# Patient Record
Sex: Female | Born: 1935 | Race: White | Hispanic: No | Smoking: Never smoker
Health system: Southern US, Community
[De-identification: ages and names within clinical notes are randomized; demographics above are authoritative.]

## PROBLEM LIST (undated history)

## (undated) DIAGNOSIS — M109 Gout, unspecified: Secondary | ICD-10-CM

## (undated) DIAGNOSIS — I2510 Atherosclerotic heart disease of native coronary artery without angina pectoris: Secondary | ICD-10-CM

## (undated) DIAGNOSIS — R82998 Other abnormal findings in urine: Secondary | ICD-10-CM

## (undated) DIAGNOSIS — G458 Other transient cerebral ischemic attacks and related syndromes: Secondary | ICD-10-CM

## (undated) DIAGNOSIS — R413 Other amnesia: Secondary | ICD-10-CM

## (undated) DIAGNOSIS — I4891 Unspecified atrial fibrillation: Secondary | ICD-10-CM

## (undated) DIAGNOSIS — N898 Other specified noninflammatory disorders of vagina: Secondary | ICD-10-CM

## (undated) DIAGNOSIS — F0390 Unspecified dementia without behavioral disturbance: Secondary | ICD-10-CM

## (undated) DIAGNOSIS — Z6828 Body mass index (BMI) 28.0-28.9, adult: Secondary | ICD-10-CM

## (undated) DIAGNOSIS — Z79899 Other long term (current) drug therapy: Secondary | ICD-10-CM

## (undated) DIAGNOSIS — Z20828 Contact with and (suspected) exposure to other viral communicable diseases: Secondary | ICD-10-CM

## (undated) DIAGNOSIS — E785 Hyperlipidemia, unspecified: Secondary | ICD-10-CM

## (undated) DIAGNOSIS — Z8669 Personal history of other diseases of the nervous system and sense organs: Secondary | ICD-10-CM

## (undated) DIAGNOSIS — M25519 Pain in unspecified shoulder: Secondary | ICD-10-CM

## (undated) DIAGNOSIS — N189 Chronic kidney disease, unspecified: Secondary | ICD-10-CM

## (undated) DIAGNOSIS — Z87440 Personal history of urinary (tract) infections: Secondary | ICD-10-CM

## (undated) DIAGNOSIS — H938X9 Other specified disorders of ear, unspecified ear: Secondary | ICD-10-CM

## (undated) DIAGNOSIS — I10 Essential (primary) hypertension: Secondary | ICD-10-CM

## (undated) DIAGNOSIS — G43909 Migraine, unspecified, not intractable, without status migrainosus: Secondary | ICD-10-CM

## (undated) DIAGNOSIS — M129 Arthropathy, unspecified: Secondary | ICD-10-CM

## (undated) HISTORY — DX: Essential (primary) hypertension: I10

## (undated) HISTORY — DX: Body mass index (BMI) 28.0-28.9, adult: Z68.28

## (undated) HISTORY — DX: Hyperlipidemia, unspecified: E78.5

## (undated) HISTORY — DX: Personal history of urinary (tract) infections: Z87.440

## (undated) HISTORY — DX: Migraine, unspecified, not intractable, without status migrainosus: G43.909

## (undated) HISTORY — PX: EXTERNAL EAR SURGERY: SHX627

## (undated) HISTORY — DX: Other long term (current) drug therapy: Z79.899

## (undated) HISTORY — DX: Other specified noninflammatory disorders of vagina: N89.8

## (undated) HISTORY — DX: Other transient cerebral ischemic attacks and related syndromes: G45.8

## (undated) HISTORY — DX: Personal history of other diseases of the nervous system and sense organs: Z86.69

## (undated) HISTORY — DX: Unspecified atrial fibrillation: I48.91

## (undated) HISTORY — DX: Gout, unspecified: M10.9

## (undated) HISTORY — DX: Other specified disorders of ear, unspecified ear: H93.8X9

## (undated) HISTORY — DX: Pain in unspecified shoulder: M25.519

## (undated) HISTORY — DX: Other amnesia: R41.3

## (undated) HISTORY — DX: Contact with and (suspected) exposure to other viral communicable diseases: Z20.828

## (undated) HISTORY — DX: Other abnormal findings in urine: R82.998

## (undated) HISTORY — DX: Arthropathy, unspecified: M12.9

---
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
## 2013-06-30 ENCOUNTER — Ambulatory Visit: Payer: Medicare Other | Admitting: Neurology

## 2013-07-05 ENCOUNTER — Encounter: Payer: Self-pay | Admitting: Neurology

## 2013-07-05 ENCOUNTER — Ambulatory Visit: Payer: Medicare Other | Admitting: Neurology

## 2013-07-06 ENCOUNTER — Ambulatory Visit: Payer: Self-pay | Admitting: Neurology

## 2013-07-19 ENCOUNTER — Telehealth: Payer: Self-pay | Admitting: Neurology

## 2013-07-19 NOTE — Telephone Encounter (Signed)
Called patient and R/S appointment

## 2013-07-19 NOTE — Telephone Encounter (Signed)
Called patient to cancel appointment, no answer will try again later

## 2013-07-20 ENCOUNTER — Ambulatory Visit: Payer: Medicare Other | Admitting: Neurology

## 2013-07-26 ENCOUNTER — Encounter: Payer: Self-pay | Admitting: Neurology

## 2013-07-26 ENCOUNTER — Ambulatory Visit (INDEPENDENT_AMBULATORY_CARE_PROVIDER_SITE_OTHER): Payer: Medicare Other | Admitting: Neurology

## 2013-07-26 ENCOUNTER — Encounter (INDEPENDENT_AMBULATORY_CARE_PROVIDER_SITE_OTHER): Payer: Self-pay

## 2013-07-26 VITALS — BP 134/69 | HR 69 | Ht 60.0 in | Wt 142.0 lb

## 2013-07-26 DIAGNOSIS — R4189 Other symptoms and signs involving cognitive functions and awareness: Secondary | ICD-10-CM

## 2013-07-26 DIAGNOSIS — I4891 Unspecified atrial fibrillation: Secondary | ICD-10-CM

## 2013-07-26 DIAGNOSIS — F09 Unspecified mental disorder due to known physiological condition: Secondary | ICD-10-CM

## 2013-07-26 NOTE — Progress Notes (Signed)
[facility]    Provider:  Dr Hosie Poisson Referring Provider: Verlon Au, MD Primary Care Physician:  [name], NP  CC:  Memory decline  HPI:  Caitlyn Lynch is a 80 y.o. female here as a referral from Dr. Leavy Cella for memory decline  Patient thinks she is fine overall but has some trouble with short term memory. She attributes it to normal aging. She is currently driving, denies getting lost. Son notes there were some times where she has gotten lost and they have now limited her driving. He notes she is forgetting big chunks of her life, both recent and remote memory. Notes an episode where she got disoriented/confused when she had a UTI/URI. Saw a neurologist at that time (around 2 years ago) told it was possible early AD.   No history of head trauma, no MVA. No strokes or TIAs. Sister passed away from AD (passed away at age 52). No EtOH or tobacco usage. No prior head imaging. Otherwise healthy. Does have history of A fib, currently taking coumadin. Has had frequent UTIs.   Per notes from PCP, had difficulty with clock drawing and a 27/30 on MMSE.   Review of Systems: Out of a complete 14 system review, the patient complains of only the following symptoms, and all other reviewed systems are negative. + memory loss, confusion, headache, depression, too much sleep, decreased energy, fatigue, hearing loss  History   Social History  . Marital Status: Widowed    Spouse Name: N/A    Number of Children: N/A  . Years of Education: N/A   Occupational History  . Not on file.   Social History Main Topics  . Smoking status: Never Smoker   . Smokeless tobacco: Not on file  . Alcohol Use: No  . Drug Use: No  . Sexual Activity: Not on file   Other Topics Concern  . Not on file   Social History Narrative  . No narrative on file    Family History  Problem Relation Age of Onset  . Cancer Father     Bladder  . Diabetes Son   . Alzheimer's disease Sister       Past Medical History  Diagnosis Date  . Other and unspecified hyperlipidemia   . Migraine, unspecified, without mention of intractable migraine without mention of status migrainosus     CT head, Negative 2014  . Subclavian steal syndrome     noted on Carotid Doppler 08/2012  . Essential hypertension, benign     rate on EKG 56-60  . Other ear problems   . Atrial fibrillation   . Gout, unspecified   . Arthropathy, unspecified, site unspecified   . Pain in joint, shoulder region   . Other cells and casts in urine   . Other specified noninflammatory disorder of vagina   . Memory loss     27/30 on MMSE  . Contact with or exposure to other viral diseases(V01.79)   . Body mass index 28.0-28.9, adult   . Encounter for long-term (current) use of other medications   . Personal history of other disorders of nervous system and sense organs     Seen by Dr. Hazle Quant   . Personal history of urinary (tract) infection     Past Surgical History  Procedure Laterality Date  . Tonsillectomy  1945    Current Outpatient Prescriptions  Medication Sig Dispense Refill  . allopurinol (ZYLOPRIM) 100 MG tablet Take 100 mg by mouth daily.      Marland Kitchen  atenolol (TENORMIN) 100 MG tablet Take 100 mg by mouth daily.      . digoxin (LANOXIN) 0.125 MG tablet Take 0.125 mg by mouth daily.      Marland Kitchen. lisinopril-hydrochlorothiazide (PRINZIDE,ZESTORETIC) 20-12.5 MG per tablet Take 1 tablet by mouth daily.      Marland Kitchen. warfarin (COUMADIN) 2.5 MG tablet Take 2.5 mg by mouth 2 (two) times a week. Taking 1 daily PO, 1/2 on Tuesday and Thursday       No current facility-administered medications for this visit.    Allergies as of 07/26/2013  . (Not on File)    Vitals: There were no vitals taken for this visit. Last Weight:  Wt Readings from Last 1 Encounters:  No data found for Wt   Last Height:   Ht Readings from Last 1 Encounters:  No data found for Ht     Physical exam: Exam: Gen: NAD, conversant Eyes: anicteric  sclerae, moist conjunctivae HENT: Atraumatic, oropharynx clear Neck: Trachea midline; supple,  Lungs: CTA, no wheezing, rales, rhonic                          CV: RRR, no MRG Abdomen: Soft, non-tender;  Extremities: No peripheral edema  Skin: Normal temperature, no rash,  Psych: Appropriate affect, pleasant  Neuro: MS:  MOCA 25/30  CN: PERRL, EOMI no nystagmus, no ptosis, sensation intact to LT V1-V3 bilat, face symmetric, no weakness, hearing grossly intact, palate elevates symmetrically, shoulder shrug 5/5 bilat,  tongue protrudes midline, no fasiculations noted.  Motor: normal bulk and tone, decreased ROM L shoulder(reported to be chronic) Strength: 5/5  In all extremities with minimal chronic weakness in LUE proximal  Coord: rapid alternating and point-to-point (FNF, HTS) movements intact.  Reflexes: symmetrical, bilat downgoing toes  Sens: LT intact in all extremities  Gait: posture, stance, stride and arm-swing normal. Difficulty with tandem gait. Able to walk on heels and toes. Romberg absent.   Assessment:  After physical and neurologic examination, review of laboratory studies, imaging, neurophysiology testing and pre-existing records, assessment will be reviewed on the problem list.  Plan:  Treatment plan and additional workup will be reviewed under Problem List.  1)Cognitive decline 2)A fib  80y/o with hx of A fib, on coumadin, presenting for initial evaluation of progressive cognitive decline. Son notes difficulty with both short and long term memory. Physical exam overall unremarkable with MOCA of 25/30. Will check lab work, MRI brain. Pending results will consider starting patient on cognitive enhancer such as Aricept. Follow up once workup completed.    Elspeth ChoPeter Janine Reller, DO  [facility] [address] [place], [place] [address]  Phone [phone] Fax [phone]

## 2013-07-26 NOTE — Patient Instructions (Signed)
Overall you are doing fairly well but I do want to suggest a few things today:   Remember to drink plenty of fluid, eat healthy meals and do not skip any meals. Try to eat protein with a every meal and eat a healthy snack such as fruit or nuts in between meals. Try to keep a regular sleep-wake schedule and try to exercise daily, particularly in the form of walking, 20-30 minutes a day, if you can.   As far as diagnostic testing:  1)Please have some blood work completed today after checking out 2)We will call you to schedule a MRI of the brain  I would like to see you back in 4 months, sooner if we need to. Please call us with any interim questions, concerns, problems, updates or refill requests.   Please also call us for any test results so we can go over those with you on the phone.  My clinical assistant and will answer any of your questions and relay your messages to me and also relay most of my messages to you.   Our phone number is [phone]. We also have an after hours call service for urgent matters and there is a physician on-call for urgent questions. For any emergencies you know to call 911 or go to the nearest emergency room

## 2013-07-28 LAB — METHYLMALONIC ACID, SERUM: Methylmalonic Acid: 460 nmol/L — ABNORMAL HIGH (ref 0–378)

## 2013-07-28 LAB — VITAMIN B1, WHOLE BLOOD: THIAMINE: 194.1 nmol/L (ref 66.5–200.0)

## 2013-07-28 LAB — VITAMIN B12: Vitamin B-12: 600 pg/mL (ref 211–946)

## 2013-07-28 LAB — TSH: TSH: 1.91 u[IU]/mL (ref 0.450–4.500)

## 2013-07-28 NOTE — Progress Notes (Signed)
Quick Note:  Shared normal lab results, patient verbalized understanding ______

## 2013-08-08 ENCOUNTER — Inpatient Hospital Stay: Admission: RE | Admit: 2013-08-08 | Payer: Self-pay | Source: Ambulatory Visit

## 2013-08-17 ENCOUNTER — Ambulatory Visit
Admission: RE | Admit: 2013-08-17 | Discharge: 2013-08-17 | Disposition: A | Discharge status: Home / Self Care | Payer: Medicare Other | Source: Ambulatory Visit | Attending: Neurology | Admitting: Neurology

## 2013-08-17 DIAGNOSIS — R4189 Other symptoms and signs involving cognitive functions and awareness: Secondary | ICD-10-CM

## 2013-08-17 DIAGNOSIS — R413 Other amnesia: Secondary | ICD-10-CM

## 2013-08-30 ENCOUNTER — Ambulatory Visit (INDEPENDENT_AMBULATORY_CARE_PROVIDER_SITE_OTHER): Payer: Medicare Other | Admitting: Neurology

## 2013-08-30 ENCOUNTER — Encounter: Payer: Self-pay | Admitting: Neurology

## 2013-08-30 ENCOUNTER — Encounter (INDEPENDENT_AMBULATORY_CARE_PROVIDER_SITE_OTHER): Payer: Self-pay

## 2013-08-30 VITALS — BP 124/64 | HR 62 | Ht 60.0 in | Wt 144.0 lb

## 2013-08-30 DIAGNOSIS — F09 Unspecified mental disorder due to known physiological condition: Secondary | ICD-10-CM

## 2013-08-30 DIAGNOSIS — R4189 Other symptoms and signs involving cognitive functions and awareness: Secondary | ICD-10-CM

## 2013-08-30 DIAGNOSIS — R4182 Altered mental status, unspecified: Secondary | ICD-10-CM

## 2013-08-30 NOTE — Progress Notes (Signed)
[facility]    Provider:  Dr Hosie PoissonSumner Referring Provider: Iona HansenJones, Penny L, NP Primary Care Physician:  Iona HansenJones, Penny L, NP  CC:  Memory decline  HPI:  Caitlyn Lynch is a 80 y.o. female here as a referral from Dr. Yetta BarreJones for memory decline. Since last visit has had a MRI brain showing thalamic microhemorrhages but otherwise unremarkable. Returns today with her son who expresses concern over recent episode of severe AMS, confusion. These episodes last <20 minutes and the son notes that they have occurred frequently. After the events she is lethargic and slow to respond.    Initial visit 07/2013: Patient thinks she is fine overall but has some trouble with short term memory. She attributes it to normal aging. She is currently driving, denies getting lost. Son notes there were some times where she has gotten lost and they have now limited her driving. He notes she is forgetting big chunks of her life, both recent and remote memory. Notes an episode where she got disoriented/confused when she had a UTI/URI. Saw a neurologist at that time (around 2 years ago) told it was possible early AD.   No history of head trauma, no MVA. No strokes or TIAs. Sister passed away from AD (passed away at age 78). No EtOH or tobacco usage. No prior head imaging. Otherwise healthy. Does have history of A fib, currently taking coumadin. Has had frequent UTIs.   Per notes from PCP, had difficulty with clock drawing and a 27/30 on MMSE.   Review of Systems: Out of a complete 14 system review, the patient complains of only the following symptoms, and all other reviewed systems are negative. + memory loss, confusion, headache, depression, too much sleep, decreased energy, fatigue, hearing loss  History   Social History  . Marital Status: Widowed    Spouse Name: N/A    Number of Children: N/A  . Years of Education: N/A   Occupational History  . Not on file.   Social History Main Topics  . Smoking  status: Never Smoker   . Smokeless tobacco: Never Used  . Alcohol Use: No  . Drug Use: No  . Sexual Activity: Not on file   Other Topics Concern  . Not on file   Social History Narrative   Single, 4 children   Right handed   12 th grade   No caffeine    Family History  Problem Relation Age of Onset  . Cancer Father     Bladder  . Diabetes Son   . Alzheimer's disease Sister     Past Medical History  Diagnosis Date  . Other and unspecified hyperlipidemia   . Migraine, unspecified, without mention of intractable migraine without mention of status migrainosus     CT head, Negative 2014  . Subclavian steal syndrome     noted on Carotid Doppler 08/2012  . Essential hypertension, benign     rate on EKG 56-60  . Other ear problems   . Atrial fibrillation   . Gout, unspecified   . Arthropathy, unspecified, site unspecified   . Pain in joint, shoulder region   . Other cells and casts in urine   . Other specified noninflammatory disorder of vagina   . Memory loss     27/30 on MMSE  . Contact with or exposure to other viral diseases(V01.79)   . Body mass index 28.0-28.9, adult   . Encounter for long-term (current) use of other medications   . Personal history  of other disorders of nervous system and sense organs     Seen by Dr. Hazle Quantigby   . Personal history of urinary (tract) infection     Past Surgical History  Procedure Laterality Date  . Tonsillectomy  1945    Current Outpatient Prescriptions  Medication Sig Dispense Refill  . allopurinol (ZYLOPRIM) 100 MG tablet Take 100 mg by mouth daily.      Marland Kitchen. atenolol (TENORMIN) 100 MG tablet Take 100 mg by mouth daily.      Marland Kitchen. lisinopril-hydrochlorothiazide (PRINZIDE,ZESTORETIC) 20-12.5 MG per tablet Take 1 tablet by mouth daily.      Marland Kitchen. warfarin (COUMADIN) 2.5 MG tablet Take 2.5 mg by mouth 2 (two) times a week. Taking 1 daily PO, 1/2 on Tuesday and Thursday       No current facility-administered medications for this visit.     Allergies as of 08/30/2013  . (No Known Allergies)    Vitals: BP 124/64  Pulse 62  Ht 5' (1.524 m)  Wt 144 lb (65.318 kg)  BMI 28.12 kg/m2 Last Weight:  Wt Readings from Last 1 Encounters:  08/30/13 144 lb (65.318 kg)   Last Height:   Ht Readings from Last 1 Encounters:  08/30/13 5' (1.524 m)     Physical exam: Exam: Gen: NAD, conversant Eyes: anicteric sclerae, moist conjunctivae HENT: Atraumatic, oropharynx clear Neck: Trachea midline; supple,  Lungs: CTA, no wheezing, rales, rhonic                          CV: RRR, no MRG Abdomen: Soft, non-tender;  Extremities: No peripheral edema  Skin: Normal temperature, no rash,  Psych: Appropriate affect, pleasant  Neuro: MS:  MOCA 25/30 same as prior visit   CN: PERRL, EOMI no nystagmus, no ptosis, sensation intact to LT V1-V3 bilat, face symmetric, no weakness, hearing grossly intact, palate elevates symmetrically, shoulder shrug 5/5 bilat,  tongue protrudes midline, no fasiculations noted.  Motor: normal bulk and tone, decreased ROM L shoulder(reported to be chronic) Strength: 5/5  In all extremities with minimal chronic weakness in LUE proximal  Coord: rapid alternating and point-to-point (FNF, HTS) movements intact.  Reflexes: symmetrical, bilat downgoing toes  Sens: LT intact in all extremities  Gait: posture, stance, stride and arm-swing normal. Difficulty with tandem gait. Able to walk on heels and toes. Romberg absent.   Assessment:  After physical and neurologic examination, review of laboratory studies, imaging, neurophysiology testing and pre-existing records, assessment will be reviewed on the problem list.  Plan:  Treatment plan and additional workup will be reviewed under Problem List.  1)Cognitive decline 2)A fib 3)HTN  80y/o with hx of A fib, on coumadin, presenting for follow evaluation of progressive cognitive decline. Son notes difficulty with both short and long term memory. Since last  visit she has had a brain MRI which showed some signs of micro-hemorrhage but otherwise unremarkable. Son notes recurrent episodes of AMS lasting around 20minutes. Unclear etiology, based on description would have concern over possible complex partial seizure. MOCA stable compared to prior visit. Will check EEG. Counseled son to check patients BP during further events. If EEG unremarkable would consider addition of Exelon or Aricept. d.    Elspeth ChoPeter Kieanna Rollo, DO  [facility] [address] [place], [place] [address]  Phone [phone] Fax [phone]

## 2013-08-30 NOTE — Patient Instructions (Signed)
Overall you are doing fairly well but I do want to suggest a few things today:   As far as diagnostic testing:  1)Please schedule an EEG when you check out. I will call you with the results of this and we can discuss next steps.  Please check you blood pressure one to two times a day. If you have any more events please check your blood pressure during one of the events.   Please also call us for any test results so we can go over those with you on the phone.  My clinical assistant and will answer any of your questions and relay your messages to me and also relay most of my messages to you.   Our phone number is [phone]. We also have an after hours call service for urgent matters and there is a physician on-call for urgent questions. For any emergencies you know to call 911 or go to the nearest emergency room

## 2013-09-09 ENCOUNTER — Ambulatory Visit (INDEPENDENT_AMBULATORY_CARE_PROVIDER_SITE_OTHER): Payer: Medicare Other

## 2013-09-09 ENCOUNTER — Other Ambulatory Visit: Payer: Medicare Other | Admitting: Radiology

## 2013-09-09 DIAGNOSIS — R4182 Altered mental status, unspecified: Secondary | ICD-10-CM

## 2013-09-09 NOTE — Procedures (Signed)
    History:   Caitlyn Lynch is an 80 year old patient with a history of a progressive memory decline. The patient is being evaluated for an episode of severe altered mental status and confusion. She will have episodes lasting less than 20 minutes, and they have been recurrent. The patient is being evaluated for these episodes.  This is a routine EEG. No skull defects are noted. Medications include allopurinol, atenolol, lisinopril, hydrochlorothiazide, and Coumadin.   EEG classification: Normal awake  Description of the recording: The background rhythms of this recording consists of a fairly well modulated medium amplitude alpha rhythm of 8 Hz that is reactive to eye opening and closure. As the record progresses, the patient appears to remain in the waking state throughout the recording. Photic stimulation was performed, resulting in a bilateral and symmetric photic driving response. Hyperventilation was also performed, resulting in a minimal buildup of the background rhythm activities without significant slowing seen. At no time during the recording does there appear to be evidence of spike or spike wave discharges or evidence of focal slowing. EKG monitor shows an irregularly irregular rhythm with a heart rate of 56.  Impression: This is a normal EEG recording in the waking state. No evidence of ictal or interictal discharges are seen. Heart rhythm is irregularly irregular.

## 2013-09-28 ENCOUNTER — Telehealth: Payer: Self-pay | Admitting: Neurology

## 2013-09-28 NOTE — Telephone Encounter (Signed)
Spoke with patient's son Susy FrizzleMatt, he states that on 09/15/13 patient fell off the back stairs, she was home alone at the time of the fall so he does not know exactly what happened, possible episodes of blacking out with complaints of dizziness and migraines, patient son is very concerned with her please advise.

## 2013-09-28 NOTE — Telephone Encounter (Signed)
Patient's son calling with information about patient, states that she fell and is having some blackouts. Please call and advise.

## 2013-09-28 NOTE — Telephone Encounter (Signed)
Please call him and ask him to have her come in for a follow up to discuss next steps. Thanks.

## 2013-09-29 NOTE — Telephone Encounter (Signed)
Spoke with patient's son Susy FrizzleMatt, she was scheduled for Monday 10/03/13 at 8:30 am.

## 2013-10-03 ENCOUNTER — Encounter (INDEPENDENT_AMBULATORY_CARE_PROVIDER_SITE_OTHER): Payer: Self-pay

## 2013-10-03 ENCOUNTER — Ambulatory Visit (INDEPENDENT_AMBULATORY_CARE_PROVIDER_SITE_OTHER): Payer: Medicare Other | Admitting: Neurology

## 2013-10-03 ENCOUNTER — Encounter: Payer: Self-pay | Admitting: Neurology

## 2013-10-03 VITALS — BP 104/68 | HR 72 | Ht 60.0 in | Wt 146.0 lb

## 2013-10-03 DIAGNOSIS — R4189 Other symptoms and signs involving cognitive functions and awareness: Secondary | ICD-10-CM

## 2013-10-03 DIAGNOSIS — I951 Orthostatic hypotension: Secondary | ICD-10-CM

## 2013-10-03 DIAGNOSIS — F09 Unspecified mental disorder due to known physiological condition: Secondary | ICD-10-CM

## 2013-10-03 DIAGNOSIS — R4182 Altered mental status, unspecified: Secondary | ICD-10-CM

## 2013-10-03 NOTE — Progress Notes (Signed)
[facility]    Provider:  Dr Hosie Poisson Referring Provider: [name], NP Primary Care Physician:  [name], NP  CC:  Memory decline  HPI:  Caitlyn Lynch is a 80 y.o. female here as a referral from Dr. Yetta Barre for memory decline and periods of blacking out. Since last visit had a severe fall, noticing episodes of dizziness and headaches. Dizziness described as a light headed sensation, improves with sitting down. Denies drinking/hydrating well. Denies any vertigo, no nausea, no palpitations. Son notes she complains about headaches frequently, she states she does not recall having headaches.    Initial visit 07/2013: Patient thinks she is fine overall but has some trouble with short term memory. She attributes it to normal aging. She is currently driving, denies getting lost. Son notes there were some times where she has gotten lost and they have now limited her driving. He notes she is forgetting big chunks of her life, both recent and remote memory. Notes an episode where she got disoriented/confused when she had a UTI/URI. Saw a neurologist at that time (around 2 years ago) told it was possible early AD.   Son who expresses concern over recent episode of severe AMS, confusion. These episodes last <20 minutes and the son notes that they have occurred frequently. After the events she is lethargic and slow to respond.   No history of head trauma, no MVA. No strokes or TIAs. Sister passed away from AD (passed away at age 65). No EtOH or tobacco usage. No prior head imaging. Otherwise healthy. Does have history of A fib, currently taking coumadin. Has had frequent UTIs.   Per notes from PCP, had difficulty with clock drawing and a 27/30 on MMSE.   Review of Systems: Out of a complete 14 system review, the patient complains of only the following symptoms, and all other reviewed systems are negative. + memory loss, confusion, headache, depression, too much sleep, decreased  energy, fatigue, hearing loss  History   Social History  . Marital Status: Widowed    Spouse Name: N/A    Number of Children: N/A  . Years of Education: N/A   Occupational History  . Not on file.   Social History Main Topics  . Smoking status: Never Smoker   . Smokeless tobacco: Never Used  . Alcohol Use: No  . Drug Use: No  . Sexual Activity: Not on file   Other Topics Concern  . Not on file   Social History Narrative   Single, 4 children   Right handed   12 th grade   No caffeine    Family History  Problem Relation Age of Onset  . Cancer Father     Bladder  . Diabetes Son   . Alzheimer's disease Sister     Past Medical History  Diagnosis Date  . Other and unspecified hyperlipidemia   . Migraine, unspecified, without mention of intractable migraine without mention of status migrainosus     CT head, Negative 2014  . Subclavian steal syndrome     noted on Carotid Doppler 08/2012  . Essential hypertension, benign     rate on EKG 56-60  . Other ear problems   . Atrial fibrillation   . Gout, unspecified   . Arthropathy, unspecified, site unspecified   . Pain in joint, shoulder region   . Other cells and casts in urine   . Other specified noninflammatory disorder of vagina   . Memory loss  27/30 on MMSE  . Contact with or exposure to other viral diseases(V01.79)   . Body mass index 28.0-28.9, adult   . Encounter for long-term (current) use of other medications   . Personal history of other disorders of nervous system and sense organs     Seen by Dr. Hazle Quantigby   . Personal history of urinary (tract) infection     Past Surgical History  Procedure Laterality Date  . Tonsillectomy  1945    Current Outpatient Prescriptions  Medication Sig Dispense Refill  . allopurinol (ZYLOPRIM) 100 MG tablet Take 100 mg by mouth daily.      Marland Kitchen. atenolol (TENORMIN) 100 MG tablet Take 100 mg by mouth daily.      Marland Kitchen. lisinopril-hydrochlorothiazide (PRINZIDE,ZESTORETIC) 20-12.5  MG per tablet Take 1 tablet by mouth daily.      Marland Kitchen. warfarin (COUMADIN) 2.5 MG tablet Take 2.5 mg by mouth 2 (two) times a week. Taking 1 daily PO, 1/2 on Tuesday and Thursday       No current facility-administered medications for this visit.    Allergies as of 10/03/2013  . (No Known Allergies)    Vitals: BP 104/68  Pulse 72  Ht 5' (1.524 m)  Wt 146 lb (66.225 kg)  BMI 28.51 kg/m2 Last Weight:  Wt Readings from Last 1 Encounters:  10/03/13 146 lb (66.225 kg)   Last Height:   Ht Readings from Last 1 Encounters:  10/03/13 5' (1.524 m)   Orthostatics (checked with manual BP) Prone 132/68 P 74 Standing 108/62 P 76  Physical exam: Exam: Gen: NAD, conversant Eyes: anicteric sclerae, moist conjunctivae HENT: Atraumatic, oropharynx clear Neck: Trachea midline; supple,  Lungs: CTA, no wheezing, rales, rhonic                          CV: RRR, no MRG Abdomen: Soft, non-tender;  Extremities: No peripheral edema  Skin: Normal temperature, no rash,  Psych: Appropriate affect, pleasant  Neuro: MS:  MOCA 25/30 same as prior visit   CN: PERRL, EOMI no nystagmus, no ptosis, sensation intact to LT V1-V3 bilat, face symmetric, no weakness, hearing grossly intact, palate elevates symmetrically, shoulder shrug 5/5 bilat,  tongue protrudes midline, no fasiculations noted.  Motor: normal bulk and tone, decreased ROM L shoulder(reported to be chronic) Strength: 5/5  In all extremities with minimal chronic weakness in LUE proximal  Coord: rapid alternating and point-to-point (FNF, HTS) movements intact.  Reflexes: symmetrical, bilat downgoing toes  Sens: LT intact in all extremities  Gait: posture, stance, stride and arm-swing normal. Difficulty with tandem gait. Able to walk on heels and toes. Romberg absent.   Assessment:  After physical and neurologic examination, review of laboratory studies, imaging, neurophysiology testing and pre-existing records, assessment will be  reviewed on the problem list.  Plan:  Treatment plan and additional workup will be reviewed under Problem List.  1)Cognitive decline 2)A fib 3)orthostatic hypotension  80y/o with hx of A fib, on coumadin, presenting for follow evaluation of progressive cognitive decline now with episodes of MAS and recently feeling light headed/dizzy upon standing. Recent EEG was unremarkable. Physical exam pertinent for signs of orthostatic hypotension, otherwise stable. Unclear etiology of her symptoms. Orthostatic hypotension can be related to poor fluid intake and/or over medication. Counseled patient to hydrate well, she will follow up with cardiologist to potentially adjust BP medications. If no improvement with correction of BP and symptoms persist can consider ambulatory EEG and/or further imaging. Discussed potential  use of cognitive enhancing medication but will hold off for now, can consider Aricept in the future.    Elspeth ChoPeter Tishana Clinkenbeard, DO  [facility] [address] [place], [place] [id number]  Phone [phone] Fax [phone]

## 2013-10-14 ENCOUNTER — Ambulatory Visit: Payer: Self-pay | Admitting: Neurology

## 2013-11-29 ENCOUNTER — Ambulatory Visit: Payer: Medicare Other | Admitting: Neurology

## 2013-12-22 NOTE — Telephone Encounter (Signed)
Noted  

## 2014-12-31 ENCOUNTER — Emergency Department (HOSPITAL_BASED_OUTPATIENT_CLINIC_OR_DEPARTMENT_OTHER)
Admission: EM | Admit: 2014-12-31 | Discharge: 2015-01-01 | Disposition: A | Discharge status: Home / Self Care | Payer: Medicare HMO | Attending: Emergency Medicine | Admitting: Emergency Medicine

## 2014-12-31 DIAGNOSIS — N189 Chronic kidney disease, unspecified: Secondary | ICD-10-CM | POA: Insufficient documentation

## 2014-12-31 DIAGNOSIS — Z79899 Other long term (current) drug therapy: Secondary | ICD-10-CM | POA: Diagnosis not present

## 2014-12-31 DIAGNOSIS — E785 Hyperlipidemia, unspecified: Secondary | ICD-10-CM | POA: Diagnosis not present

## 2014-12-31 DIAGNOSIS — Z7982 Long term (current) use of aspirin: Secondary | ICD-10-CM | POA: Diagnosis not present

## 2014-12-31 DIAGNOSIS — F0390 Unspecified dementia without behavioral disturbance: Secondary | ICD-10-CM | POA: Insufficient documentation

## 2014-12-31 DIAGNOSIS — I2510 Atherosclerotic heart disease of native coronary artery without angina pectoris: Secondary | ICD-10-CM | POA: Diagnosis not present

## 2014-12-31 DIAGNOSIS — M109 Gout, unspecified: Secondary | ICD-10-CM | POA: Diagnosis not present

## 2014-12-31 DIAGNOSIS — Z87440 Personal history of urinary (tract) infections: Secondary | ICD-10-CM | POA: Diagnosis not present

## 2014-12-31 DIAGNOSIS — N61 Inflammatory disorders of breast: Secondary | ICD-10-CM | POA: Insufficient documentation

## 2014-12-31 DIAGNOSIS — I129 Hypertensive chronic kidney disease with stage 1 through stage 4 chronic kidney disease, or unspecified chronic kidney disease: Secondary | ICD-10-CM | POA: Insufficient documentation

## 2014-12-31 DIAGNOSIS — N611 Abscess of the breast and nipple: Secondary | ICD-10-CM

## 2014-12-31 DIAGNOSIS — G43909 Migraine, unspecified, not intractable, without status migrainosus: Secondary | ICD-10-CM | POA: Diagnosis not present

## 2014-12-31 DIAGNOSIS — I4891 Unspecified atrial fibrillation: Secondary | ICD-10-CM | POA: Insufficient documentation

## 2014-12-31 HISTORY — DX: Unspecified dementia, unspecified severity, without behavioral disturbance, psychotic disturbance, mood disturbance, and anxiety: F03.90

## 2014-12-31 HISTORY — DX: Atherosclerotic heart disease of native coronary artery without angina pectoris: I25.10

## 2014-12-31 HISTORY — DX: Chronic kidney disease, unspecified: N18.9

## 2015-01-01 ENCOUNTER — Encounter (HOSPITAL_BASED_OUTPATIENT_CLINIC_OR_DEPARTMENT_OTHER): Payer: Self-pay

## 2015-01-01 MED ORDER — DOXYCYCLINE HYCLATE 100 MG PO TABS
100.0000 mg | ORAL_TABLET | Freq: Once | ORAL | Status: AC
  Administered 2015-01-01: 100 mg via ORAL
  Filled 2015-01-01: qty 1

## 2015-01-01 MED ORDER — DOXYCYCLINE HYCLATE 100 MG PO CAPS: 100.0000 mg | ORAL_CAPSULE | Freq: Two times a day (BID) | ORAL | Status: DC

## 2015-01-01 MED ORDER — MUPIROCIN CALCIUM 2 % EX CREA
TOPICAL_CREAM | Freq: Once | CUTANEOUS | Status: AC
  Administered 2015-01-01: 01:00:00 via TOPICAL
  Filled 2015-01-01: qty 15

## 2015-01-01 NOTE — ED Notes (Addendum)
Pt presents with son to ED as patient has dementia - pt reports left nipple wound, edema. Unknown how long area has been there. Pt with angioplasty via groin and three cardiac stents place in July. Pt traveled to area from [place] one week ago.

## 2015-01-01 NOTE — ED Provider Notes (Signed)
CSN: [id number]     Arrival date & time 12/31/14  2352 History  This chart was scribed for Paula Libra, MD by Placido Sou, ED scribe. This patient was seen in room MH06/MH06 and the patient's care was started at 12:14 AM.   Chief Complaint  Patient presents with  . Breast Problem   HPI  HPI Comments: Caitlyn Lynch is a 81 y.o. female, with a hx of dementia, who presents to the Emergency Department complaining of soreness, discoloration and swelling to her left nipple that she noticed earlier today. She is unsure of its onset. Symptoms are mild, pain is worse with palpation. She notes that it has been a long time since her last mammogram. Her son notes that she has recently also been complaining about acute pain to her bilateral arms that worsens when she reaches and grabs objects and he suspects arthritis; she will follow-up with her PCP regarding this. Her son notes she just had 3 stents put in place while living in [place] and further notes she was recently dx with an ovarian cyst which has not yet been treated.   Past Medical History  Diagnosis Date  . Other and unspecified hyperlipidemia   . Migraine, unspecified, without mention of intractable migraine without mention of status migrainosus     CT head, Negative 2014  . Subclavian steal syndrome     noted on Carotid Doppler 08/2012  . Essential hypertension, benign     rate on EKG 56-60  . Other ear problems   . Atrial fibrillation   . Gout, unspecified   . Arthropathy, unspecified, site unspecified   . Pain in joint, shoulder region   . Other cells and casts in urine   . Other specified noninflammatory disorder of vagina   . Memory loss     27/30 on MMSE  . Contact with or exposure to other viral diseases(V01.79)   . Body mass index 28.0-28.9, adult   . Encounter for long-term (current) use of other medications   . Personal history of other disorders of nervous system and sense organs     Seen by Dr. Hazle Quant   . Personal  history of urinary (tract) infection   . Dementia   . Coronary artery disease   . CKD (chronic kidney disease)    Past Surgical History  Procedure Laterality Date  . Tonsillectomy  1945   Family History  Problem Relation Age of Onset  . Cancer Father     Bladder  . Diabetes Son   . Alzheimer's disease Sister    Social History  Substance Use Topics  . Smoking status: Never Smoker   . Smokeless tobacco: Never Used  . Alcohol Use: No   OB History    No data available     Review of Systems  All other systems reviewed and are negative.  Allergies  Review of patient's allergies indicates no known allergies.  Home Medications   Prior to Admission medications   Medication Sig Start Date End Date Taking? Authorizing Provider  allopurinol (ZYLOPRIM) 100 MG tablet Take 100 mg by mouth daily.   Yes Historical Provider, MD  AMLODIPINE BESYLATE PO Take by mouth.   Yes Historical Provider, MD  apixaban (ELIQUIS) 5 MG TABS tablet Take 5 mg by mouth 2 (two) times daily.   Yes Historical Provider, MD  aspirin 81 MG tablet Take 81 mg by mouth daily.   Yes Historical Provider, MD  atenolol (TENORMIN) 100 MG tablet Take 100 mg  by mouth daily.   Yes Historical Provider, MD  Cholecalciferol (VITAMIN D-3 PO) Take by mouth.   Yes Historical Provider, MD  Clopidogrel Bisulfate (PLAVIX PO) Take by mouth.   Yes Historical Provider, MD  Donepezil HCl (ARICEPT PO) Take by mouth.   Yes Historical Provider, MD  losartan-hydrochlorothiazide (HYZAAR) 50-12.5 MG per tablet Take 1 tablet by mouth daily.   Yes Historical Provider, MD  nitroGLYCERIN (NITROSTAT) 0.4 MG SL tablet Place 0.4 mg under the tongue every 5 (five) minutes as needed for chest pain.   Yes Historical Provider, MD  Simvastatin (ZOCOR PO) Take by mouth.   Yes Historical Provider, MD  doxycycline (VIBRAMYCIN) 100 MG capsule Take 1 capsule (100 mg total) by mouth 2 (two) times daily. One po bid x 7 days 01/01/15   Lasya Vetter, MD   BP  152/69 mmHg  Pulse 85  Temp(Src) 98.5 F (36.9 C) (Oral)  Resp 18  Ht  (1.499 m)  Wt 158 lb (71.668 kg)  BMI 31.89 kg/m2  SpO2 98%   Physical Exam  General: Well-developed, well-nourished female in no acute distress; appearance consistent with age of record HENT: normocephalic; atraumatic Eyes: pupils equal, round and reactive to light; extraocular muscles intact Neck: supple Heart: regular rate and rhythm; no murmurs, rubs or gallops Lungs: clear to auscultation bilaterally Breasts: Breasts symmetric with equivocal mass palpated in left breast RUQ; lesion of left nipple that is violaceous with a pocket of purulent material; no axillary lymphadenopathy Abdomen: soft; nondistended; nontender; no masses or hepatosplenomegaly; bowel sounds present Extremities: No deformity; full range of motion; pulses normal Neurologic: Awake, alert; motor function intact in all extremities and symmetric; no facial droop Skin: Warm and dry Psychiatric: Normal mood and affect  ED Course  Procedures  DIAGNOSTIC STUDIES: Oxygen Saturation is 98% on RA, normal by my interpretation.    COORDINATION OF CARE: 12:20 AM Discussed treatment plan with pt at bedside and pt agreed to plan.  12:22 AM Breast exam completed and. Recommended that patient get a mammogram which can be arranged through her PCP.  INCISION AND DRAINAGE Performed by: Paula Libra L Consent: Verbal consent obtained. Risks and benefits: risks, benefits and alternatives were discussed Type: abscess  Body area: Left nipple  Anesthesia: local infiltration  Incision was made with an 18-gauge needle.  Local anesthetic: None   Complexity: Simple Blunt dissection to break up loculations  Drainage: purulent  Drainage amount: Moderate   Packing material: None   Patient tolerance: Patient tolerated the procedure well with no immediate complications.    MDM   Final diagnoses:  Abscess of left nipple     Paula Libra, MD 01/01/15 [phone]

## 2015-01-01 NOTE — Discharge Instructions (Signed)
Abscess °An abscess (boil or furuncle) is an infected area on or under the skin. This area is filled with yellowish-white fluid (pus) and other material (debris). °HOME CARE  °· Only take medicines as told by your doctor. °· If you were given antibiotic medicine, take it as directed. Finish the medicine even if you start to feel better. °· If gauze is used, follow your doctor's directions for changing the gauze. °· To avoid spreading the infection: °¨ Keep your abscess covered with a bandage. °¨ Wash your hands well. °¨ Do not share personal care items, towels, or whirlpools with others. °¨ Avoid skin contact with others. °· Keep your skin and clothes clean around the abscess. °· Keep all doctor visits as told. °GET HELP RIGHT AWAY IF:  °· You have more pain, puffiness (swelling), or redness in the wound site. °· You have more fluid or blood coming from the wound site. °· You have muscle aches, chills, or you feel sick. °· You have a fever. °MAKE SURE YOU:  °· Understand these instructions. °· Will watch your condition. °· Will get help right away if you are not doing well or get worse. °Document Released: 10/22/2007 Document Revised: 11/04/2011 Document Reviewed: 07/18/2011 °ExitCare® Patient Information ©2015 [employer]. This information is not intended to replace advice given to you by your health care provider. Make sure you discuss any questions you have with your health care provider. ° °

## 2015-01-02 ENCOUNTER — Other Ambulatory Visit: Payer: Self-pay | Admitting: Nurse Practitioner

## 2015-01-02 DIAGNOSIS — N6321 Unspecified lump in the left breast, upper outer quadrant: Secondary | ICD-10-CM

## 2015-01-04 LAB — CULTURE, ROUTINE-ABSCESS
Culture: NO GROWTH
Gram Stain: NONE SEEN

## 2015-01-19 ENCOUNTER — Ambulatory Visit
Admission: RE | Admit: 2015-01-19 | Discharge: 2015-01-19 | Disposition: A | Discharge status: Home / Self Care | Payer: Medicare HMO | Source: Ambulatory Visit | Attending: Nurse Practitioner | Admitting: Nurse Practitioner

## 2015-01-19 DIAGNOSIS — N6321 Unspecified lump in the left breast, upper outer quadrant: Secondary | ICD-10-CM

## 2015-05-07 ENCOUNTER — Ambulatory Visit: Payer: Medicare HMO | Admitting: Neurology

## 2015-06-14 ENCOUNTER — Encounter: Payer: Self-pay | Admitting: Neurology

## 2015-06-14 ENCOUNTER — Encounter: Payer: Self-pay | Admitting: *Deleted

## 2015-06-14 ENCOUNTER — Ambulatory Visit (INDEPENDENT_AMBULATORY_CARE_PROVIDER_SITE_OTHER): Payer: Medicare HMO | Admitting: Neurology

## 2015-06-14 VITALS — BP 145/67 | HR 62 | Ht 59.0 in | Wt 158.8 lb

## 2015-06-14 DIAGNOSIS — R4189 Other symptoms and signs involving cognitive functions and awareness: Secondary | ICD-10-CM

## 2015-06-14 MED ORDER — MEMANTINE HCL-DONEPEZIL HCL ER 28-10 MG PO CP24: 1.0000 | ORAL_CAPSULE | Freq: Every day | ORAL | Status: DC

## 2015-06-14 NOTE — Patient Instructions (Addendum)
Overall you are doing fairly well but I do want to suggest a few things today:   Remember to drink plenty of fluid, eat healthy meals and do not skip any meals. Try to eat protein with a every meal and eat a healthy snack such as fruit or nuts in between meals. Try to keep a regular sleep-wake schedule and try to exercise daily, particularly in the form of walking, 20-30 minutes a day, if you can.   As far as your medications are concerned, I would like to suggest: Continue Aricept  I would like to see you back in 6 months, sooner if we need to. Please call us with any interim questions, concerns, problems, updates or refill requests.   Please also call us for any test results so we can go over those with you on the phone.  My clinical assistant and will answer any of your questions and relay your messages to me and also relay most of my messages to you.   Our phone number is [phone]. We also have an after hours call service for urgent matters and there is a physician on-call for urgent questions. For any emergencies you know to call 911 or go to the nearest emergency room

## 2015-06-14 NOTE — Progress Notes (Signed)
[facility]    Provider:  Dr Lucia Gaskins Referring Provider: [name], NP Primary Care Physician:  [name], NP   CC: Memory decline  06/13/2013: She is a new patient to me who is formally seen Dr. Hosie Poisson. She lives with her son who is here and provides most information. Past medical history of high cholesterol, atrial fibrillation, cognitive decline, It waxes and wanes. She forgets conversations, things she has been recently told. It is more recent memories. She has a very good memory of past things. 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  It was difficult for a while, she was angry and she felt people were taking away her control and they would hold back and she would call back crying saying she couldn't do anything. She now lives with her son and 7 grandchildren. She had 2 siblings with Alzheimers. When she gets infections, her mental status changes and she gets combative. There are no delusions. She is in a good period right now.  MRi brain 08/2013: IMPRESSION:  Abnormal MRI brain (without) demonstrating: 1. Mild perisylvian atrophy. 2. Mild chronic small vessel ischemic disease. 3. Few right thalamic and right temporal chronic cerebral microhemorrhages noted on SWI views, could be related to underlying chronic small vessel ischemic disease or amyloid pathology.  B12 and TSH wnl 07/2013   Dr. Hosie Poisson 08/30/2013: MARGUITA VENNING is a 82 y.o. female here as a referral from Dr. Yetta Barre for memory decline. Since last visit has had a MRI brain showing thalamic microhemorrhages but otherwise unremarkable. Returns today with her son who expresses concern over recent episode of severe AMS, confusion. These episodes last <20 minutes and the son notes that they have occurred frequently. After the events she is lethargic and slow to respond.    Initial visit 07/2013: Patient thinks she is fine overall but  has some trouble with short term memory. She attributes it to normal aging. She is currently driving, denies getting lost. Son notes there were some times where she has gotten lost and they have now limited her driving. He notes she is forgetting big chunks of her life, both recent and remote memory. Notes an episode where she got disoriented/confused when she had a UTI/URI. Saw a neurologist at that time (around 2 years ago) told it was possible early AD.   No history of head trauma, no MVA. No strokes or TIAs. Sister passed away from AD (passed away at age 21). No EtOH or tobacco usage. No prior head imaging. Otherwise healthy. Does have history of A fib, currently taking coumadin. Has had frequent UTIs.   Per notes from PCP, had difficulty with clock drawing and a 27/30 on MMSE.   ROS: Fatigue, blurred vision, loss of vision, feeling cold, joint pain, runny nose, memory loss, confusion, dizziness, insomnia, sleepiness, tremor, depression, too much sleep, not enough sleep, disinterest in activities, hallucinations, racing thoughts. All others negative.  Social History   Social History  . Marital Status: Widowed    Spouse Name: N/A  . Number of Children: 4  . Years of Education: 12   Occupational History  . Not on file.   Social History Main Topics  . Smoking status: Never Smoker   . Smokeless tobacco: Never Used  . Alcohol Use: No  . Drug Use: No  . Sexual Activity: Not on file   Other Topics Concern  . Not on file   Social History  Narrative   Lives: with son, [name]   Single, 4 children   Right handed   12 th grade   No caffeine    Family History  Problem Relation Age of Onset  . Cancer Father     Bladder  . Diabetes Son   . Alzheimer's disease Sister     Past Medical History  Diagnosis Date  . Other and unspecified hyperlipidemia   . Migraine, unspecified, without mention of intractable migraine without mention of status migrainosus     CT head, Negative 2014  .  Subclavian steal syndrome     noted on Carotid Doppler 08/2012  . Essential hypertension, benign     rate on EKG 56-60  . Other ear problems   . Atrial fibrillation (HCC)   . Gout, unspecified   . Arthropathy, unspecified, site unspecified   . Pain in joint, shoulder region   . Other cells and casts in urine   . Other specified noninflammatory disorder of vagina   . Memory loss     27/30 on MMSE  . Contact with or exposure to other viral diseases(V01.79)   . Body mass index 28.0-28.9, adult   . Encounter for long-term (current) use of other medications   . Personal history of other disorders of nervous system and sense organs     Seen by Dr. Hazle Quant   . Personal history of urinary (tract) infection   . Dementia   . Coronary artery disease   . CKD (chronic kidney disease)     Past Surgical History  Procedure Laterality Date  . Tonsillectomy  1945  . External ear surgery      Current Outpatient Prescriptions  Medication Sig Dispense Refill  . allopurinol (ZYLOPRIM) 100 MG tablet Take 100 mg by mouth daily.    Marland Kitchen AMLODIPINE BESYLATE PO Take 5 mg by mouth.     Marland Kitchen apixaban (ELIQUIS) 5 MG TABS tablet Take 5 mg by mouth 2 (two) times daily.    Marland Kitchen atenolol (TENORMIN) 100 MG tablet Take 100 mg by mouth daily.    Marland Kitchen Clopidogrel Bisulfate (PLAVIX PO) Take 75 mg by mouth daily.     Marland Kitchen losartan-hydrochlorothiazide (HYZAAR) 50-12.5 MG per tablet Take 1 tablet by mouth daily. 100-12.5 mg    . nitroGLYCERIN (NITROSTAT) 0.4 MG SL tablet Place 0.4 mg under the tongue every 5 (five) minutes as needed for chest pain.    . Simvastatin (ZOCOR PO) Take 20 mg by mouth daily.     . traMADol (ULTRAM) 50 MG tablet Take 50 mg by mouth every 6 (six) hours as needed.    . [DISCONTINUED] lisinopril-hydrochlorothiazide (PRINZIDE,ZESTORETIC) 20-12.5 MG per tablet Take 1 tablet by mouth daily.    . [DISCONTINUED] warfarin (COUMADIN) 2.5 MG tablet Take 2.5 mg by mouth 2 (two) times a week. Taking 1 daily PO, 1/2 on  Tuesday and Thursday     No current facility-administered medications for this visit.    Allergies as of 06/14/2015  . (No Known Allergies)    Vitals: BP 145/67 mmHg  Pulse 62  Ht  (1.499 m)  Wt 158 lb 12.8 oz (72.031 kg)  BMI 32.06 kg/m2 Last Weight:  Wt Readings from Last 1 Encounters:  06/14/15 158 lb 12.8 oz (72.031 kg)   Last Height:   Ht Readings from Last 1 Encounters:  06/14/15  (1.499 m)   Physical exam: Exam: Gen: NAD,  obese  Eyes: Conjunctivae clear without exudates or hemorrhage  Neuro: Detailed Neurologic Exam  Speech:    Speech is normal Cognition:    MMSE 25/30 Cranial Nerves:    The pupils are equal, round, and reactive to light.  Visual fields are full to finger confrontation. Extraocular movements are intact. Trigeminal sensation is intact and the muscles of mastication are normal. The face is symmetric. The palate elevates in the midline. Hearing intact. Voice is normal. Shoulder shrug is normal. The tongue has normal motion without fasciculations.   Coordination:    Normal finger to nose and heel to shin.   Gait:    Not shuffling difficulty with tandem  Motor Observation:    No asymmetry, no atrophy, and no involuntary movements noted. Tone:    Normal muscle tone.    Posture:    Posture is normal. normal erect    Strength:    Strength is V/V in the upper and lower limbs.      Sensation: intact to LT         Assessment/Plan:  82 year old female with PMHx cognitive decline, afib. She has been on Aricept. MMSE in 2015 27/30. Today 25/30. F/u 6 months. They may be interested in the CREAD clinical trial.  Naomie Dean, MD  [facility] [address] [place], [place] [address]  Phone [phone] Fax [phone]  A total of 30 minutes was spent face-to-face with this patient. Over half this time was spent on counseling patient on the cognitive decline diagnosis and different  diagnostic and therapeutic options available.

## 2015-06-28 ENCOUNTER — Telehealth: Payer: Self-pay

## 2015-06-28 NOTE — Telephone Encounter (Signed)
I spoke to the patient's son, Molli Hazard, in regards to the CREAD study. Molli Hazard stated that they are not interested in participating due to length of the study. I thanked him for his time and consideration.

## 2015-06-28 NOTE — Telephone Encounter (Signed)
Dr Lucia Gaskins- they turned down wanting to participate in study. Namzaric got denied by pt insurance. Do you want to call in something different? Please advise

## 2015-06-29 ENCOUNTER — Other Ambulatory Visit: Payer: Self-pay | Admitting: Neurology

## 2015-06-29 ENCOUNTER — Telehealth: Payer: Self-pay | Admitting: Neurology

## 2015-06-29 MED ORDER — DONEPEZIL HCL 10 MG PO TABS: 10.0000 mg | ORAL_TABLET | Freq: Two times a day (BID) | ORAL | Status: DC

## 2015-06-29 MED ORDER — MEMANTINE HCL 10 MG PO TABS: 10.0000 mg | ORAL_TABLET | Freq: Two times a day (BID) | ORAL | Status: DC

## 2015-06-29 NOTE — Telephone Encounter (Signed)
Yes, since the namzeric was denied she will have to take both Aricept and Namenda twice a day. Let husband know the Namzeric was denies so he will have to take his Aricept  daily as well as namenda twice daily. I will call it in and he can discuss with the pharmacy if he is confused about it thanks.

## 2015-06-29 NOTE — Telephone Encounter (Signed)
Spoke to son. Namzeric(combo aricept  and namenda XR) not approved. She was on Aricept  twice daily and was changed to aricept qam and namzaric qpm. So no [place] she will have to take aricept  twice a day and namenda  twice a day instead. He acknowledges understanding.

## 2015-12-12 ENCOUNTER — Encounter: Payer: Self-pay | Admitting: Nurse Practitioner

## 2015-12-12 ENCOUNTER — Ambulatory Visit (INDEPENDENT_AMBULATORY_CARE_PROVIDER_SITE_OTHER): Payer: Medicare HMO | Admitting: Nurse Practitioner

## 2015-12-12 VITALS — BP 117/56 | HR 71 | Ht 59.0 in | Wt 158.4 lb

## 2015-12-12 DIAGNOSIS — R4189 Other symptoms and signs involving cognitive functions and awareness: Secondary | ICD-10-CM

## 2015-12-12 NOTE — Patient Instructions (Addendum)
Continue Aricept at current dose  Exercise for overall health and well-being chair exercises are fine Follow-up in 6 months next visit with Dr. Lucia Gaskins

## 2015-12-12 NOTE — Progress Notes (Addendum)
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 Hosie Lynch. She lives with her son who is here and provides most information. Past medical history of high cholesterol, atrial fibrillation, cognitive decline, It waxes and wanes. She forgets conversations, things she has been recently told. It is more recent memories. She has a very good memory of past things. 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  It was difficult for a while, she was angry and she felt people were taking away her control and they would hold back and she would call back crying saying she couldn't do anything. She now lives with her son and 7 grandchildren. She had 2 siblings with Alzheimers. When she gets infections, her mental status changes and she gets combative. There are no delusions. She is in a good period right now. UPDATE 07/26/2017CM Caitlyn Lynch, 82 year old female returns for follow-up. She has a history of progressive memory disorder for at least 5 years. When last seen she was placed on Namzeric but started having side effects to the medication and stopped it. She went back to Aricept 10 mg 2 tablets daily she is not on Namenda. Memory score is stable today at 27/30. Son reports that she has had some flexing her arm and it is tremulous. She occasionally has tremor to her voice. The last time this happened was 3 months ago. He is concerned that she is getting Parkinson's. I see no evidence of that on exam today. She  has a diagnosed with restless legs Mirapex with good results. Her son claims that her appetite has changed however her weight is stable at 158. She returns for reevaluation. No recent falls  REVIEW OF SYSTEMS: Full 14 system review of  systems performed and notable only for those listed, all others are neg:  Constitutional: Fatigue  Cardiovascular: neg Ear/Nose/Throat: neg  Skin: neg Eyes: Blurred vision Respiratory: neg Gastroitestinal: Urinary frequency Hematology/Lymphatic: Easy bruising  Endocrine: neg Musculoskeletal: Walking difficulty Allergy/Immunology: neg Neurological: Memory loss Psychiatric: Confusion Sleep : Restless leg, daytime sleepiness   ALLERGIES: Allergies  Allergen Reactions  . Memantine Other (See Comments)    hallucinations    HOME MEDICATIONS: Outpatient Medications Prior to Visit  Medication Sig Dispense Refill  . allopurinol (ZYLOPRIM) 100 MG tablet Take 100 mg by mouth daily.    Marland Kitchen AMLODIPINE BESYLATE PO Take 5 mg by mouth.     Marland Kitchen apixaban (ELIQUIS) 5 MG TABS tablet Take 5 mg by mouth 2 (two) times daily.    Marland Kitchen atenolol (TENORMIN) 100 MG tablet Take 100 mg by mouth daily.    Marland Kitchen Clopidogrel Bisulfate (PLAVIX PO) Take 75 mg by mouth daily.     Marland Kitchen donepezil (ARICEPT) 10 MG tablet Take 1 tablet (10 mg total) by mouth 2 (two) times daily. (Patient taking differently: Take 20 mg by mouth at bedtime. ) 60 tablet 11  . Simvastatin (ZOCOR PO) Take 20 mg by mouth daily.     . traMADol (ULTRAM) 50 MG tablet Take 50 mg by mouth every 6 (six) hours as needed.    . nitroGLYCERIN (NITROSTAT) 0.4 MG SL tablet Place 0.4 mg under the tongue every 5 (five) minutes as needed for chest pain.    Marland Kitchen losartan-hydrochlorothiazide (HYZAAR) 50-12.5 MG  per tablet Take 1 tablet by mouth daily. 100-12.5 mg    . memantine (NAMENDA) 10 MG tablet Take 1 tablet (10 mg total) by mouth 2 (two) times daily. 60 tablet 11   No facility-administered medications prior to visit.     PAST MEDICAL HISTORY: Past Medical History:  Diagnosis Date  . Arthropathy, unspecified, site unspecified   . Atrial fibrillation (HCC)   . Body mass index 28.0-28.9, adult   . CKD (chronic kidney disease)   . Contact with or exposure to  other viral diseases(V01.79)   . Coronary artery disease   . Dementia   . Encounter for long-term (current) use of other medications   . Essential hypertension, benign    rate on EKG 56-60  . Gout, unspecified   . Memory loss    27/30 on MMSE  . Migraine, unspecified, without mention of intractable migraine without mention of status migrainosus    CT head, Negative 2014  . Other and unspecified hyperlipidemia   . Other cells and casts in urine   . Other ear problems   . Other specified noninflammatory disorder of vagina   . Pain in joint, shoulder region   . Personal history of other disorders of nervous system and sense organs    Seen by Dr. Hazle Quant   . Personal history of urinary (tract) infection   . Subclavian steal syndrome    noted on Carotid Doppler 08/2012    PAST SURGICAL HISTORY: Past Surgical History:  Procedure Laterality Date  . EXTERNAL EAR SURGERY    . TONSILLECTOMY  1945    FAMILY HISTORY: Family History  Problem Relation Age of Onset  . Cancer Father     Bladder  . Diabetes Son   . Alzheimer's disease Sister     SOCIAL HISTORY: Social History   Social History  . Marital status: Widowed    Spouse name: N/A  . Number of children: 4  . Years of education: 12   Occupational History  . Not on file.   Social History Main Topics  . Smoking status: Never Smoker  . Smokeless tobacco: Never Used  . Alcohol use No  . Drug use: No  . Sexual activity: Not on file   Other Topics Concern  . Not on file   Social History Narrative   Lives: with son, [name]   Single, 4 children   Right handed   12 th grade   No caffeine     PHYSICAL EXAM  Vitals:   12/12/15 1411  BP: (!) 117/56  Pulse: 71  Weight: 158 lb 6.4 oz (71.8 kg)  Height:  (1.499 m)   Body mass index is 31.99 kg/m.  Generalized: Well developed, Obese female in no acute distress  Head: normocephalic and atraumatic,. Oropharynx benign  Neck: Supple, no carotid bruits  Cardiac:  Regular rate rhythm, no murmur  Musculoskeletal: No deformity   Neurological examination   Mentation: Alert oriented to time, place, history taking. MMSE 27/30 missing items in orientation and 1 of 3 recall. AFT 7. Attention span and concentration appropriate.   Follows all commands speech and language fluent.   Cranial nerve II-XII: Fundoscopic exam reveals sharp disc margins.Pupils were equal round reactive to light extraocular movements were full, visual field were full on confrontational test. No decreased blink Facial sensation and strength were normal. hearing was intact to finger rubbing bilaterally. Uvula tongue midline. head turning and shoulder shrug were normal and symmetric.Tongue protrusion into cheek strength was normal.  Negative Myerson sign Motor: normal bulk and tone, full strength in the BUE, BLE, fine finger movements normal, no pronator drift. No focal weakness. No cogwheeling Sensory: normal and symmetric to light touch, pinprick, and  Vibration, Coordination: finger-nose-finger, heel-to-shin bilaterally, no dysmetria, no bradykinesia Reflexes: Symmetric upper and lower, plantar responses were flexor bilaterally. Gait and Station: Rising up from seated position without assistance, normal stance,  moderate stride, decreased  arm swing, smooth turning, ambulated 210 feet in 55 seconds. No assistive device   DIAGNOSTIC DATA (LABS, IMAGING, TESTING) - IASSESSMENT AND PLAN 82 year old female with PMHx cognitive decline, afib. She had side effects to Namzaric and now is back on Aricept 10 mg 2 caps daily MMSE 27/30.  F/u 6 months. They are not interested in the CREAD clinical trial. Son is concerned that she may be getting Parkinson disease. I see no signs on objective exam.  PLAN: Continue Aricept at current dose  Exercise for overall health and well-being chair exercises are fine Follow-up in 6 months next visit with Dr. Hassan Buckler Darrol Angel, [facility], [facility], APRN  [facility] [address] [place], [place] [address] [phone]  Personally  participated in and made any corrections needed to history, physical, neuro exam,assessment and plan as stated above.   Naomie Dean, MD [facility]

## 2016-04-02 ENCOUNTER — Encounter: Payer: Self-pay | Admitting: Neurology

## 2016-04-02 ENCOUNTER — Ambulatory Visit (INDEPENDENT_AMBULATORY_CARE_PROVIDER_SITE_OTHER): Payer: Medicare HMO | Admitting: Neurology

## 2016-04-02 VITALS — BP 130/52 | HR 64 | Wt 137.2 lb

## 2016-04-02 DIAGNOSIS — G3183 Dementia with Lewy bodies: Secondary | ICD-10-CM | POA: Diagnosis not present

## 2016-04-02 DIAGNOSIS — F0280 Dementia in other diseases classified elsewhere without behavioral disturbance: Secondary | ICD-10-CM

## 2016-04-02 NOTE — Patient Instructions (Signed)
Remember to drink plenty of fluid, eat healthy meals and do not skip any meals. Try to eat protein with a every meal and eat a healthy snack such as fruit or nuts in between meals. Try to keep a regular sleep-wake schedule and try to exercise daily, particularly in the form of walking, 20-30 minutes a day, if you can.   As far as your medications are concerned, I would like to suggest: Continue Aricept  As far as diagnostic testing: Neurocognitive testing. Lewy Body Dementia  I would like to see you back in 4 months, sooner if we need to. Please call us with any interim questions, concerns, problems, updates or refill requests.   Our phone number is [phone]. We also have an after hours call service for urgent matters and there is a physician on-call for urgent questions. For any emergencies you know to call 911 or go to the nearest emergency room

## 2016-04-02 NOTE — Progress Notes (Signed)
[facility]    Provider:  Dr Caitlyn Lynch Referring Provider: [name], NP Primary Care Physician:  Caitlyn Hansen, NP   CC: Memory decline  04/02/2016: Memory is progressively worsening. Here with son.  She was in the hospital. MMSE is 26/30, she is not washing, walking away from the stove and leaving it on. She stopped putting makeup on. More decline in short-term memory. There is no tremor on exam but they saw their doctor who suspects parkinson's disease. She denies depression. She doesn't want to be social anymore, she falls asleep all the time, she falls asleep during the day. She is having hallucinations. She thinks her x-husband gets in bed with her. People com ein and out of her room that are not there, it was worse on the [place]. Now on Aricept only. She will ask the same question over and over again. Son is requesting further workup including formal memory testing.   12/12/2015: Past medical history of high cholesterol, atrial fibrillation, cognitive decline, It waxes and wanes. She forgets conversations, things she has been recently told. It is more recent memories. She has a very good memory of past things. 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 It was difficult for a while, she was angry and she felt people were taking away her control and they would hold back and she would call back crying saying she couldn't do anything. She now lives with her son and 7 grandchildren. She had 2 siblings with Alzheimers. When she gets infections, her mental status changes and she gets combative. There are no delusions. She is in a good period right now.  06/13/2013: She is a new patient to me who is formally seen Caitlyn Lynch. She lives with her son who is here and provides most information. Past medical history of high cholesterol, atrial fibrillation, cognitive decline, It waxes and wanes. She forgets  conversations, things she has been recently told. It is more recent memories. She has a very good memory of past things. 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  It was difficult for a while, she was angry and she felt people were taking away her control and they would hold back and she would call back crying saying she couldn't do anything. She now lives with her son and 7 grandchildren. She had 2 siblings with Alzheimers. When she gets infections, her mental status changes and she gets combative. There are no delusions. She is in a good period right now. Runny nose and drooling.   MRi brain 08/2013: IMPRESSION:  Abnormal MRI brain (without) demonstrating: 1. Mild perisylvian atrophy. 2. Mild chronic small vessel ischemic disease. 3. Few right thalamic and right temporal chronic cerebral microhemorrhages noted on SWI views, could be related to underlying chronic small vessel ischemic disease or amyloid pathology.  B12 and TSH wnl 07/2013   Caitlyn Lynch 08/30/2013: Caitlyn Lynch is a 82 y.o. female here as a referral from Caitlyn. Yetta Lynch for memory decline. Since last visit has had a MRI brain showing thalamic microhemorrhages but otherwise unremarkable. Returns today with her son who expresses concern over recent episode of severe AMS, confusion. These episodes last <20 minutes and the son notes that they have occurred frequently. After the events she is lethargic and slow to respond.    Initial visit 07/2013: Patient thinks she is fine overall but has some trouble with  short term memory. She attributes it to normal aging. She is currently driving, denies getting lost. Son notes there were some times where she has gotten lost and they have now limited her driving. He notes she is forgetting big chunks of her life, both recent and remote memory. Notes an episode where she got disoriented/confused when she had a UTI/URI. Saw a  neurologist at that time (around 2 years ago) told it was possible early AD.   No history of head trauma, no MVA. No strokes or TIAs. Sister passed away from AD (passed away at age 80). No EtOH or tobacco usage. No prior head imaging. Otherwise healthy. Does have history of A fib, currently taking coumadin. Has had frequent UTIs.   Per notes from PCP, had difficulty with clock drawing and a 27/30 on MMSE.   ROS: Fatigue, blurred vision, loss of vision, feeling cold, joint pain, runny nose, memory loss, confusion, dizziness, insomnia, sleepiness, tremor, depression, too much sleep, not enough sleep, disinterest in activities, hallucinations, racing thoughts. All others negative.   Social History   Social History  . Marital status: Widowed    Spouse name: N/A  . Number of children: 4  . Years of education: 12   Occupational History  . Not on file.   Social History Main Topics  . Smoking status: Never Smoker  . Smokeless tobacco: Never Used  . Alcohol use No  . Drug use: No  . Sexual activity: Not on file   Other Topics Concern  . Not on file   Social History Narrative   Lives: with son, Susy Lynch   Single, 4 children   Right handed   12 th grade   No caffeine    Family History  Problem Relation Age of Onset  . Cancer Father     Bladder  . Diabetes Son   . Alzheimer's disease Sister     Past Medical History:  Diagnosis Date  . Arthropathy, unspecified, site unspecified   . Atrial fibrillation (HCC)   . Body mass index 28.0-28.9, adult   . CKD (chronic kidney disease)   . Contact with or exposure to other viral diseases(V01.79)   . Coronary artery disease   . Dementia   . Encounter for long-term (current) use of other medications   . Essential hypertension, benign    rate on EKG 56-60  . Gout, unspecified   . Memory loss    27/30 on MMSE  . Migraine, unspecified, without mention of intractable migraine without mention of status migrainosus    CT head, Negative  2014  . Other and unspecified hyperlipidemia   . Other cells and casts in urine   . Other ear problems   . Other specified noninflammatory disorder of vagina   . Pain in joint, shoulder region   . Personal history of other disorders of nervous system and sense organs    Seen by Caitlyn. Hazle Quantigby   . Personal history of urinary (tract) infection   . Subclavian steal syndrome    noted on Carotid Doppler 08/2012    Past Surgical History:  Procedure Laterality Date  . EXTERNAL EAR SURGERY    . TONSILLECTOMY  1945    Current Outpatient Prescriptions  Medication Sig Dispense Refill  . allopurinol (ZYLOPRIM) 100 MG tablet Take 100 mg by mouth daily.    Marland Kitchen. AMLODIPINE BESYLATE PO Take 5 mg by mouth.     Marland Kitchen. apixaban (ELIQUIS) 5 MG TABS tablet Take 5 mg by mouth 2 (two)  times daily.    Marland Kitchen. atenolol (TENORMIN) 100 MG tablet Take 100 mg by mouth daily.    Marland Kitchen. Clopidogrel Bisulfate (PLAVIX PO) Take 75 mg by mouth daily.     Marland Kitchen. donepezil (ARICEPT) 10 MG tablet Take 1 tablet (10 mg total) by mouth 2 (two) times daily. (Patient taking differently: Take 20 mg by mouth at bedtime. ) 60 tablet 11  . losartan-hydrochlorothiazide (HYZAAR) 100-12.5 MG tablet     . nitroGLYCERIN (NITROSTAT) 0.4 MG SL tablet Place 0.4 mg under the tongue every 5 (five) minutes as needed for chest pain.    . pantoprazole (PROTONIX) 20 MG tablet     . pramipexole (MIRAPEX) 0.25 MG tablet     . Simvastatin (ZOCOR PO) Take 20 mg by mouth daily.     . traMADol (ULTRAM) 50 MG tablet Take 50 mg by mouth every 6 (six) hours as needed.     No current facility-administered medications for this visit.     Allergies as of 04/02/2016 - Review Complete 04/02/2016  Allergen Reaction Noted  . Memantine Other (See Comments) 11/08/2015    Vitals: BP (!) 130/52 (BP Location: Right Arm, Patient Position: Sitting, Cuff Size: Normal)   Pulse 64   Wt 137 lb 3.2 oz (62.2 kg)   BMI 27.71 kg/m  Last Weight:  Wt Readings from Last 1 Encounters:    04/02/16 137 lb 3.2 oz (62.2 kg)   Last Height:   Ht Readings from Last 1 Encounters:  12/12/15 4\' 11"  (1.499 m)    Physical exam: Exam: Gen: NAD,  obese                 Eyes: Conjunctivae clear without exudates or hemorrhage  Neuro: Detailed Neurologic Exam  Speech:    Speech is normal Cognition:  MMSE - Mini Mental State Exam 04/02/2016 12/12/2015  Orientation to time 5 5  Orientation to Place 3 3  Registration 3 3  Attention/ Calculation 5 5  Recall 3 2  Language- name 2 objects 2 2  Language- repeat 1 1  Language- follow 3 step command 1 3  Language- read & follow direction 1 1  Write a sentence 1 1  Copy design 1 1  Total score 26 27      MMSE 25/30 Januray 2017 Cranial Nerves: Hypomimia    The pupils are equal, round, and reactive to light.  Visual fields are full to finger confrontation. Extraocular movements are intact. Trigeminal sensation is intact and the muscles of mastication are normal. The face is symmetric. The palate elevates in the midline. Hearing intact. Voice is normal. Shoulder shrug is normal. The tongue has normal motion without fasciculations.   Coordination:    Normal finger to nose and heel to shin.   Gait:    Not shuffling, some mildly dec arm swing R>L,  difficulty with tandem  Motor Observation:    No asymmetry, no atrophy, and no involuntary movements noted. Tone:    Mild cogwheeling on the right with facilitation  Posture:    Posture is normal. normal erect    Strength:    Strength is V/V in the upper and lower limbs.      Sensation: intact to LT         Assessment/Plan:  82 year old female with PMHx progressive cognitive decline, afib. She has been on Aricept. MMSE in 2015 27/30. Today 26/30. F/u 6 months. They declined the CREAD clinical trial.   - Patient with hallucinations, progressive memory  loss, may be Lewy Body dementia. Son requests formal neurocognitive testing. They live in [place], would like formal  memory testing in [facility] with Caitlyn. Jacquelyne Balint.   - Continue aricept.  Naomie Dean, MD  [facility] [address] [place], [place] [address]  Phone [phone] Fax [phone]  A total of 30 minutes was spent face-to-face with this patient. Over half this time was spent on counseling patient on the cognitive decline diagnosis and different diagnostic and therapeutic options available.

## 2016-04-05 DIAGNOSIS — F0280 Dementia in other diseases classified elsewhere without behavioral disturbance: Secondary | ICD-10-CM | POA: Insufficient documentation

## 2016-04-05 DIAGNOSIS — G3183 Dementia with Lewy bodies: Secondary | ICD-10-CM

## 2016-06-16 ENCOUNTER — Ambulatory Visit: Payer: Medicare HMO | Admitting: Neurology

## 2016-07-30 ENCOUNTER — Ambulatory Visit: Payer: Medicare HMO | Admitting: Neurology

## 2016-09-11 ENCOUNTER — Ambulatory Visit: Payer: Medicare HMO | Admitting: Neurology

## 2016-10-16 ENCOUNTER — Ambulatory Visit: Payer: Medicare HMO | Admitting: Neurology

## 2017-05-01 ENCOUNTER — Emergency Department (HOSPITAL_COMMUNITY): Payer: Medicare HMO

## 2017-05-01 ENCOUNTER — Inpatient Hospital Stay (HOSPITAL_COMMUNITY)
Admission: EM | Admit: 2017-05-01 | Discharge: 2017-05-03 | DRG: 071 | Disposition: A | Discharge status: Hospice | Payer: Medicare HMO | Attending: Internal Medicine | Admitting: Internal Medicine

## 2017-05-01 ENCOUNTER — Other Ambulatory Visit: Payer: Self-pay

## 2017-05-01 ENCOUNTER — Inpatient Hospital Stay (HOSPITAL_COMMUNITY): Payer: Medicare HMO

## 2017-05-01 DIAGNOSIS — F0281 Dementia in other diseases classified elsewhere with behavioral disturbance: Secondary | ICD-10-CM | POA: Diagnosis not present

## 2017-05-01 DIAGNOSIS — T68XXXA Hypothermia, initial encounter: Secondary | ICD-10-CM | POA: Diagnosis not present

## 2017-05-01 DIAGNOSIS — W182XXA Fall in (into) shower or empty bathtub, initial encounter: Secondary | ICD-10-CM | POA: Diagnosis present

## 2017-05-01 DIAGNOSIS — Z7189 Other specified counseling: Secondary | ICD-10-CM | POA: Diagnosis not present

## 2017-05-01 DIAGNOSIS — Z79899 Other long term (current) drug therapy: Secondary | ICD-10-CM | POA: Diagnosis not present

## 2017-05-01 DIAGNOSIS — R269 Unspecified abnormalities of gait and mobility: Secondary | ICD-10-CM | POA: Diagnosis present

## 2017-05-01 DIAGNOSIS — Z7901 Long term (current) use of anticoagulants: Secondary | ICD-10-CM

## 2017-05-01 DIAGNOSIS — Z87440 Personal history of urinary (tract) infections: Secondary | ICD-10-CM

## 2017-05-01 DIAGNOSIS — S0003XA Contusion of scalp, initial encounter: Secondary | ICD-10-CM | POA: Diagnosis present

## 2017-05-01 DIAGNOSIS — I959 Hypotension, unspecified: Secondary | ICD-10-CM | POA: Diagnosis present

## 2017-05-01 DIAGNOSIS — I2510 Atherosclerotic heart disease of native coronary artery without angina pectoris: Secondary | ICD-10-CM | POA: Diagnosis present

## 2017-05-01 DIAGNOSIS — M109 Gout, unspecified: Secondary | ICD-10-CM | POA: Diagnosis present

## 2017-05-01 DIAGNOSIS — R680 Hypothermia, not associated with low environmental temperature: Secondary | ICD-10-CM | POA: Diagnosis present

## 2017-05-01 DIAGNOSIS — R001 Bradycardia, unspecified: Secondary | ICD-10-CM | POA: Diagnosis present

## 2017-05-01 DIAGNOSIS — R10819 Abdominal tenderness, unspecified site: Secondary | ICD-10-CM | POA: Diagnosis present

## 2017-05-01 DIAGNOSIS — N179 Acute kidney failure, unspecified: Secondary | ICD-10-CM | POA: Diagnosis not present

## 2017-05-01 DIAGNOSIS — Z8052 Family history of malignant neoplasm of bladder: Secondary | ICD-10-CM

## 2017-05-01 DIAGNOSIS — G3183 Dementia with Lewy bodies: Secondary | ICD-10-CM | POA: Diagnosis present

## 2017-05-01 DIAGNOSIS — Z833 Family history of diabetes mellitus: Secondary | ICD-10-CM

## 2017-05-01 DIAGNOSIS — Z66 Do not resuscitate: Secondary | ICD-10-CM | POA: Diagnosis present

## 2017-05-01 DIAGNOSIS — N183 Chronic kidney disease, stage 3 (moderate): Secondary | ICD-10-CM | POA: Diagnosis present

## 2017-05-01 DIAGNOSIS — I482 Chronic atrial fibrillation: Secondary | ICD-10-CM | POA: Diagnosis present

## 2017-05-01 DIAGNOSIS — W19XXXA Unspecified fall, initial encounter: Secondary | ICD-10-CM

## 2017-05-01 DIAGNOSIS — F0391 Unspecified dementia with behavioral disturbance: Secondary | ICD-10-CM | POA: Diagnosis not present

## 2017-05-01 DIAGNOSIS — I129 Hypertensive chronic kidney disease with stage 1 through stage 4 chronic kidney disease, or unspecified chronic kidney disease: Secondary | ICD-10-CM | POA: Diagnosis present

## 2017-05-01 DIAGNOSIS — I10 Essential (primary) hypertension: Secondary | ICD-10-CM | POA: Diagnosis not present

## 2017-05-01 DIAGNOSIS — R339 Retention of urine, unspecified: Secondary | ICD-10-CM | POA: Diagnosis present

## 2017-05-01 DIAGNOSIS — G9341 Metabolic encephalopathy: Principal | ICD-10-CM | POA: Diagnosis present

## 2017-05-01 DIAGNOSIS — K219 Gastro-esophageal reflux disease without esophagitis: Secondary | ICD-10-CM | POA: Diagnosis not present

## 2017-05-01 DIAGNOSIS — Z515 Encounter for palliative care: Secondary | ICD-10-CM | POA: Diagnosis not present

## 2017-05-01 DIAGNOSIS — R4182 Altered mental status, unspecified: Secondary | ICD-10-CM | POA: Diagnosis not present

## 2017-05-01 DIAGNOSIS — Z820 Family history of epilepsy and other diseases of the nervous system: Secondary | ICD-10-CM | POA: Diagnosis not present

## 2017-05-01 DIAGNOSIS — N189 Chronic kidney disease, unspecified: Secondary | ICD-10-CM

## 2017-05-01 LAB — BASIC METABOLIC PANEL
Anion gap: 9 (ref 5–15)
BUN: 56 mg/dL — AB (ref 6–20)
CALCIUM: 9.5 mg/dL (ref 8.9–10.3)
CHLORIDE: 108 mmol/L (ref 101–111)
CO2: 21 mmol/L — ABNORMAL LOW (ref 22–32)
CREATININE: 1.72 mg/dL — AB (ref 0.44–1.00)
GFR calc Af Amer: 30 mL/min — ABNORMAL LOW (ref >60)
GFR, EST NON AFRICAN AMERICAN: 26 mL/min — AB (ref >60)
Glucose, Bld: 100 mg/dL — ABNORMAL HIGH (ref 65–99)
Potassium: 4.1 mmol/L (ref 3.5–5.1)
SODIUM: 138 mmol/L (ref 135–145)

## 2017-05-01 LAB — URINALYSIS, ROUTINE W REFLEX MICROSCOPIC
Bilirubin Urine: NEGATIVE
GLUCOSE, UA: NEGATIVE mg/dL
Hgb urine dipstick: NEGATIVE
KETONES UR: NEGATIVE mg/dL
Leukocytes, UA: NEGATIVE
Nitrite: NEGATIVE
Protein, ur: NEGATIVE mg/dL
Specific Gravity, Urine: 1.014 (ref 1.005–1.030)
pH: 5 (ref 5.0–8.0)

## 2017-05-01 LAB — PROTIME-INR
INR: 1.28
Prothrombin Time: 15.9 seconds — ABNORMAL HIGH (ref 11.4–15.2)

## 2017-05-01 LAB — CBC
HCT: 30.4 % — ABNORMAL LOW (ref 36.0–46.0)
Hemoglobin: 9.9 g/dL — ABNORMAL LOW (ref 12.0–15.0)
MCH: 29.7 pg (ref 26.0–34.0)
MCHC: 32.6 g/dL (ref 30.0–36.0)
MCV: 91.3 fL (ref 78.0–100.0)
PLATELETS: 228 10*3/uL (ref 150–400)
RBC: 3.33 MIL/uL — AB (ref 3.87–5.11)
RDW: 13.6 % (ref 11.5–15.5)
WBC: 9.8 10*3/uL (ref 4.0–10.5)

## 2017-05-01 LAB — TSH: TSH: 1.881 u[IU]/mL (ref 0.350–4.500)

## 2017-05-01 LAB — CBG MONITORING, ED: Glucose-Capillary: 100 mg/dL — ABNORMAL HIGH (ref 65–99)

## 2017-05-01 MED ORDER — SODIUM CHLORIDE 0.9 % IV SOLN
INTRAVENOUS | Status: DC
  Administered 2017-05-01 – 2017-05-02 (×4): via INTRAVENOUS

## 2017-05-01 MED ORDER — MORPHINE SULFATE (PF) 4 MG/ML IV SOLN: 2.0000 mg | INTRAVENOUS | Status: DC | PRN

## 2017-05-01 MED ORDER — SODIUM CHLORIDE 0.9 % IV BOLUS (SEPSIS)
1000.0000 mL | Freq: Once | INTRAVENOUS | Status: AC
  Administered 2017-05-01: 1000 mL via INTRAVENOUS

## 2017-05-01 MED ORDER — ACETAMINOPHEN 500 MG PO TABS
1000.0000 mg | ORAL_TABLET | Freq: Once | ORAL | Status: AC
  Administered 2017-05-01: 1000 mg via ORAL
  Filled 2017-05-01: qty 2

## 2017-05-01 MED ORDER — LORAZEPAM 2 MG/ML IJ SOLN: 1.0000 mg | Freq: Four times a day (QID) | INTRAMUSCULAR | Status: DC | PRN

## 2017-05-01 NOTE — ED Notes (Signed)
Dr. Liu at bedside 

## 2017-05-01 NOTE — ED Provider Notes (Addendum)
[facility]   CSN: [id number] Arrival date & time: 05/01/17  1238     History   Chief Complaint Chief Complaint  Patient presents with  . Fall    HPI Caitlyn Lynch is a 83 y.o. female.  HPI  83 year old female who presents after unwitnessed fall.  She has a history of dementia, atrial fibrillation on Eliquis, coronary artery disease, and chronic kidney disease.  She lives at home with her son.  He reports that normally she walks back and forth between her bedroom and the bathroom on a routine basis.  Today when his wife had walked by her bathroom she was breathing heavily in the bathtub with hematoma to the back of her head.  She does state that she fell in the bathtub, but is unable to give any further history.  She currently denies any pain or complaints.  Her son states that more recently she has become occasionally aggressive and agitated.  They are concerned that she may not be eating and drinking normally.  No recent illnesses including fever, cough, shortness of breath, complaints of pain, vomiting, diarrhea, or changes in her urine.  Past Medical History:  Diagnosis Date  . Arthropathy, unspecified, site unspecified   . Atrial fibrillation (HCC)   . Body mass index 28.0-28.9, adult   . CKD (chronic kidney disease)   . Contact with or exposure to other viral diseases(V01.79)   . Coronary artery disease   . Dementia   . Encounter for long-term (current) use of other medications   . Essential hypertension, benign    rate on EKG 56-60  . Gout, unspecified   . Memory loss    27/30 on MMSE  . Migraine, unspecified, without mention of intractable migraine without mention of status migrainosus    CT head, Negative 2014  . Other and unspecified hyperlipidemia   . Other cells and casts in urine   . Other ear problems   . Other specified noninflammatory disorder of vagina   . Pain in joint, shoulder region   . Personal  history of other disorders of nervous system and sense organs    Seen by Dr. Hazle Quantigby   . Personal history of urinary (tract) infection   . Subclavian steal syndrome    noted on Carotid Doppler 08/2012    Patient Active Problem List   Diagnosis Date Noted  . Dementia with Lewy bodies 04/05/2016  . Cognitive decline 07/26/2013  . Atrial fibrillation (HCC) 07/26/2013    Past Surgical History:  Procedure Laterality Date  . EXTERNAL EAR SURGERY    . TONSILLECTOMY  1945    OB History    No data available       Home Medications    Prior to Admission medications   Medication Sig Start Date End Date Taking? Authorizing Provider  allopurinol (ZYLOPRIM) 100 MG tablet Take 100 mg by mouth daily.    [provider]  AMLODIPINE BESYLATE PO Take 5 mg by mouth.     [provider]  apixaban (ELIQUIS) 5 MG TABS tablet Take 5 mg by mouth 2 (two) times daily.    [provider]  atenolol (TENORMIN) 100 MG tablet Take 100 mg by mouth daily.    [provider]  Clopidogrel Bisulfate (PLAVIX PO) Take 75 mg by mouth daily.     [provider]  donepezil (ARICEPT) 10 MG tablet Take 1 tablet (10 mg total) by mouth 2 (two) times daily.  Patient taking differently: Take 20 mg by mouth at bedtime.  06/29/15   Anson Fret, MD  losartan-hydrochlorothiazide [facility]) 100-12.5 MG tablet  11/14/15   [provider]  nitroGLYCERIN (NITROSTAT) 0.4 MG SL tablet Place 0.4 mg under the tongue every 5 (five) minutes as needed for chest pain.    [provider]  pantoprazole (PROTONIX) 20 MG tablet  11/19/15   [provider]  pramipexole (MIRAPEX) 0.25 MG tablet  11/23/15   [provider]  Simvastatin (ZOCOR PO) Take 20 mg by mouth daily.     [provider]  traMADol (ULTRAM) 50 MG tablet Take 50 mg by mouth every 6 (six) hours as needed.    [provider]    Family History Family History  Problem Relation Age of  Onset  . Cancer Father        Bladder  . Diabetes Son   . Alzheimer's disease Sister     Social History Social History   Tobacco Use  . Smoking status: Never Smoker  . Smokeless tobacco: Never Used  Substance Use Topics  . Alcohol use: No  . Drug use: No     Allergies   Memantine   Review of Systems Review of Systems  Unable to perform ROS: Dementia     Physical Exam Updated Vital Signs BP (!) 145/75   Pulse 70   Temp (!) 92 F (33.3 C) (Rectal)   Resp (!) 35   SpO2 99%   Physical Exam Physical Exam  Nursing note and vitals reviewed. Constitutional: Well developed, well nourished, non-toxic, and in no acute distress Head: Normocephalic and hematoma to the posterior head.  Mouth/Throat: Oropharynx is clear and moist.  Neck: Normal range of motion. Neck supple.  Cardiovascular: Normal rate and regular rhythm.   Pulmonary/Chest: Effort normal and breath sounds normal.  Abdominal: Soft. There is no tenderness. There is no rebound and no guarding.  Musculoskeletal: Normal range of motion.  No deformity  Neurological: Alert, no facial droop, fluent speech, moves all extremities symmetrically, sensation to light touch intact throughout, pupils equal reactive to light Skin: Skin is warm and dry.  Psychiatric: Cooperative   ED Treatments / Results  Labs (all labs ordered are listed, but only abnormal results are displayed) Labs Reviewed  BASIC METABOLIC PANEL - Abnormal; Notable for the following components:      Result Value   CO2 21 (*)    Glucose, Bld 100 (*)    BUN 56 (*)    Creatinine, Ser 1.72 (*)    GFR calc non Af Amer 26 (*)    GFR calc Af Amer 30 (*)    All other components within normal limits  CBC - Abnormal; Notable for the following components:   RBC 3.33 (*)    Hemoglobin 9.9 (*)    HCT 30.4 (*)    All other components within normal limits  PROTIME-INR - Abnormal; Notable for the following components:   Prothrombin Time 15.9 (*)    All  other components within normal limits  CBG MONITORING, ED - Abnormal; Notable for the following components:   Glucose-Capillary 100 (*)    All other components within normal limits  CULTURE, BLOOD (ROUTINE X 2)  CULTURE, BLOOD (ROUTINE X 2)  URINE CULTURE  URINALYSIS, ROUTINE W REFLEX MICROSCOPIC  TSH    EKG  EKG Interpretation  Date/Time:  Friday May 01 2017 13:14:35 EST Ventricular Rate:  45 PR Interval:  252 QRS Duration: 96 QT  Interval:  530 QTC Calculation: 458 R Axis:   -34 Text Interpretation:  Sinus bradycardia with 1st degree A-V block Left axis deviation Left ventricular hypertrophy with repolarization abnormality Cannot rule out Septal infarct , age undetermined Abnormal ECG no prior EKG  Confirmed by Crista CurbLiu, Rahim Astorga [phone]([id number]) on 05/01/2017 1:26:08 PM       Radiology Dg Chest 2 View  Result Date: 05/01/2017 CLINICAL DATA:  Status post suspected fall. EXAM: CHEST  2 VIEW COMPARISON:  05/29/2016 FINDINGS: Mildly enlarged cardiac silhouette. Calcific atherosclerotic disease and tortuosity of the aorta. Mediastinal contours appear intact. There is no evidence of pneumothorax or focal consolidation. Increased interstitial markings. Osseous structures are without acute abnormality. Soft tissues are grossly normal. IMPRESSION: Enlarged cardiac silhouette with mild pulmonary vascular congestion. Electronically Signed   By: Ted Mcalpineobrinka  Dimitrova M.D.   On: 05/01/2017 14:01   Ct Head Wo Contrast  Result Date: 05/01/2017 CLINICAL DATA:  Unwitnessed fall. Found in bathtub. Left posterior head injury. EXAM: CT HEAD WITHOUT CONTRAST CT CERVICAL SPINE WITHOUT CONTRAST TECHNIQUE: Multidetector CT imaging of the head and cervical spine was performed following the standard protocol without intravenous contrast. Multiplanar CT image reconstructions of the cervical spine were also generated. COMPARISON:  10/31/2015 brain MRI. FINDINGS: CT HEAD FINDINGS Brain: No evidence of parenchymal  hemorrhage or extra-axial fluid collection. No mass lesion, mass effect, or midline shift. No CT evidence of acute infarction. Nonspecific mild to moderate subcortical and periventricular white matter hypodensity, most in keeping with chronic small vessel ischemic change. Cerebral volume is age appropriate. No ventriculomegaly. Vascular: No acute abnormality. Skull: No evidence of calvarial fracture. Sinuses/Orbits: Tiny fluid level in the left sphenoid sinus. Otherwise clear visualized paranasal sinuses. Other: Small to moderate left posterior scalp hematoma. Postsurgical changes are present in the right mastoid. The left mastoid air cells are unopacified. CT CERVICAL SPINE FINDINGS Alignment: Straightening of the cervical spine. No subluxation. Dens is well positioned between the lateral masses of C1. Skull base and vertebrae: No acute fracture. No primary bone lesion or focal pathologic process. Soft tissues and spinal canal: No prevertebral fluid or swelling. No visible canal hematoma. Disc levels: Moderate multilevel degenerative disc disease in the mid to lower cervical spine, most prominent at C5-6. Advanced bilateral facet arthropathy, asymmetrically prominent on the left. No significant degenerative foraminal stenosis. Upper chest: Apical right upper lobe 5 mm solid pulmonary nodule (series 9/ image 68). Other: Clear left mastoid air cells. Postsurgical changes in the right mastoid. No discrete thyroid nodules. No pathologically enlarged cervical nodes. IMPRESSION: 1. Small to moderate left posterior scalp hematoma. 2. No evidence of acute intracranial abnormality. No evidence of calvarial fracture. 3. No cervical spine fracture or subluxation. 4. Mild-to-moderate chronic small vessel ischemic change in the cerebral white matter. 5. Tiny fluid level in the left sphenoid sinus, cannot exclude mild acute sinusitis. 6. Moderate degenerative changes in the cervical spine as detailed. 7. Apical right upper lobe  5 mm solid pulmonary nodule. No follow-up needed if patient is low-risk. Non-contrast chest CT can be considered in 12 months if patient is high-risk. This recommendation follows the consensus statement: Guidelines for Management of Incidental Pulmonary Nodules Detected on CT Images: From the Fleischner Society 2017; Radiology 2017; 284:228-243. Electronically Signed   By: Delbert PhenixJason A Poff M.D.   On: 05/01/2017 13:53   Ct Cervical Spine Wo Contrast  Result Date: 05/01/2017 CLINICAL DATA:  Unwitnessed fall. Found in bathtub. Left posterior head injury. EXAM: CT HEAD WITHOUT CONTRAST CT CERVICAL SPINE WITHOUT  CONTRAST TECHNIQUE: Multidetector CT imaging of the head and cervical spine was performed following the standard protocol without intravenous contrast. Multiplanar CT image reconstructions of the cervical spine were also generated. COMPARISON:  10/31/2015 brain MRI. FINDINGS: CT HEAD FINDINGS Brain: No evidence of parenchymal hemorrhage or extra-axial fluid collection. No mass lesion, mass effect, or midline shift. No CT evidence of acute infarction. Nonspecific mild to moderate subcortical and periventricular white matter hypodensity, most in keeping with chronic small vessel ischemic change. Cerebral volume is age appropriate. No ventriculomegaly. Vascular: No acute abnormality. Skull: No evidence of calvarial fracture. Sinuses/Orbits: Tiny fluid level in the left sphenoid sinus. Otherwise clear visualized paranasal sinuses. Other: Small to moderate left posterior scalp hematoma. Postsurgical changes are present in the right mastoid. The left mastoid air cells are unopacified. CT CERVICAL SPINE FINDINGS Alignment: Straightening of the cervical spine. No subluxation. Dens is well positioned between the lateral masses of C1. Skull base and vertebrae: No acute fracture. No primary bone lesion or focal pathologic process. Soft tissues and spinal canal: No prevertebral fluid or swelling. No visible canal hematoma.  Disc levels: Moderate multilevel degenerative disc disease in the mid to lower cervical spine, most prominent at C5-6. Advanced bilateral facet arthropathy, asymmetrically prominent on the left. No significant degenerative foraminal stenosis. Upper chest: Apical right upper lobe 5 mm solid pulmonary nodule (series 9/ image 68). Other: Clear left mastoid air cells. Postsurgical changes in the right mastoid. No discrete thyroid nodules. No pathologically enlarged cervical nodes. IMPRESSION: 1. Small to moderate left posterior scalp hematoma. 2. No evidence of acute intracranial abnormality. No evidence of calvarial fracture. 3. No cervical spine fracture or subluxation. 4. Mild-to-moderate chronic small vessel ischemic change in the cerebral white matter. 5. Tiny fluid level in the left sphenoid sinus, cannot exclude mild acute sinusitis. 6. Moderate degenerative changes in the cervical spine as detailed. 7. Apical right upper lobe 5 mm solid pulmonary nodule. No follow-up needed if patient is low-risk. Non-contrast chest CT can be considered in 12 months if patient is high-risk. This recommendation follows the consensus statement: Guidelines for Management of Incidental Pulmonary Nodules Detected on CT Images: From the Fleischner Society 2017; Radiology 2017; 284:228-243. Electronically Signed   By: Delbert Phenix M.D.   On: 05/01/2017 13:53    Procedures Procedures (including critical care time) CRITICAL CARE Performed by: Lavera Guise   Total critical care time: 31 minutes  Critical care time was exclusive of separately billable procedures and treating other patients.  Critical care was necessary to treat or prevent imminent or life-threatening deterioration.  Critical care was time spent personally by me on the following activities: management of hypothermia, development of treatment plan with patient and/or surrogate as well as nursing, discussions with consultants, evaluation of patient's response  to treatment, examination of patient, obtaining history from patient or surrogate, ordering and performing treatments and interventions, ordering and review of laboratory studies, ordering and review of radiographic studies, pulse oximetry and re-evaluation of patient's condition.  Medications Ordered in ED Medications  acetaminophen (TYLENOL) tablet 1,000 mg (1,000 mg Oral Given 05/01/17 1505)     Initial Impression / Assessment and Plan / ED Course  I have reviewed the triage vital signs and the nursing notes.  Pertinent labs & imaging results that were available during my care of the patient were reviewed by me and considered in my medical decision making (see chart for details).     83 year old female who presents with a witnessed fall.  Mental  status close to baseline per son on patient arrival.  She has no focal neurological deficits.  Does have hematoma to the back of her head but no other injuries noted on exam.  CT head and cervical spine obtained as patient anticoagulated and given her age.  Imaging studies are visualized and shows no acute traumatic injuries.  She incidentally is hypothermic in the ED.  Rectal temperature of 91.5.  Unclear etiology.  Hemodynamically is stable, and has sinus bradycardia which may be related to hypothermia.  She is placed in [employer]  No signs of infection on urine or chest x-ray.  No other major electrolyte or metabolic derangements on blood work.  Has chronic kidney disease with elevated creatinine of 1.7.  This is close to her baseline as she has outside blood work through [facility] in August 2018 with creatinine of 1.68.  Thyroid studies are also ordered.  She also has anemia with a hemoglobin of 9.9.  This is also her baseline as she has had CBCs done through [facility] showing hemoglobin between 9.6 and 10.6.  Unclear etiology of her hypothermia currently.  Discussed with hospitalist service will admit for ongoing management.  Patient's son is concerned for  potential difficulty caring for patient at home and is requesting potential placement into nursing facility when patient discharged.  This is discussed with hospitalist service.   Final Clinical Impressions(s) / ED Diagnoses   Final diagnoses:  Fall, initial encounter  Hypothermia, initial encounter    ED Discharge Orders    None       Lavera Guise, MD 05/01/17 1514    Lavera Guise, MD 05/01/17 1515

## 2017-05-01 NOTE — ED Triage Notes (Signed)
Pt arrived EMS from a doctors office s/p unwitnessed fall. Pt found by family in an empty bathtub with a large hematoma to the back left of her head. Unknown LOC due to dementia. Pt does take Eliquis for hx of afib. Per family pt has a significant cardiac history, progressive kidney failure, and has recently had decline in mental status changes and decrease in appetite.

## 2017-05-01 NOTE — ED Notes (Signed)
Bair Hugger in place.  Settings: High Temp High [employer]

## 2017-05-01 NOTE — H&P (Signed)
History and Physical    Caitlyn RollingRuth V Lynch YNW:[id number] DOB: [birth year] DOA: 05/01/2017  PCP: Iona HansenJones, Penny L, NP Patient coming from: Home  Chief Complaint: fall and weakness  HPI: Caitlyn Lynch is a 83 y.o. female with medical history significant of Dementia, Atrial fibrillation, CAD, Chronic renal disease. Pt was found to have a temperature of 91.5 when her ED vital signs were taken.  Pt lives with her family.  Pt's family reports pt paces between bathroom and her bedroom.  Pt's Daughter in law noticed pt went to the bathroom but did not come out for about 15 minutes.  When she went to check on pt she had fallen and hit her head on the bathtub.   Son reports pt was breathing heavy. Family reports pt has recently had a decrease in her appetite.  She has been giving her food to the dog. Pt has had recent increasing confusion.  Pt has not had any recent illness. No cough, no uti symptoms, no diarrhea or vomiting.  Family reports they do not want aggressive treatment.     ED Course: Pt was seen in the ED by Dr. Verdie MosherLiu.  Pt had a Ct scan of her head to evaluate for stroke and injury post fall.  Pt was placed under Bare hugger blanket.  Pt's temperature remained low at 92 rectally after 3 hours.    Review of Systems: As per HPI otherwise all other systems reviewed and are negative   Ambulatory Status  Previously ambulatiory  Past Medical History:  Diagnosis Date  . Arthropathy, unspecified, site unspecified   . Atrial fibrillation (HCC)   . Body mass index 28.0-28.9, adult   . CKD (chronic kidney disease)   . Contact with or exposure to other viral diseases(V01.79)   . Coronary artery disease   . Dementia   . Encounter for long-term (current) use of other medications   . Essential hypertension, benign    rate on EKG 56-60  . Gout, unspecified   . Memory loss    27/30 on MMSE  . Migraine, unspecified, without mention of intractable migraine without mention of status migrainosus    CT head,  Negative 2014  . Other and unspecified hyperlipidemia   . Other cells and casts in urine   . Other ear problems   . Other specified noninflammatory disorder of vagina   . Pain in joint, shoulder region   . Personal history of other disorders of nervous system and sense organs    Seen by Dr. Hazle Quantigby   . Personal history of urinary (tract) infection   . Subclavian steal syndrome    noted on Carotid Doppler 08/2012    Past Surgical History:  Procedure Laterality Date  . EXTERNAL EAR SURGERY    . TONSILLECTOMY  1945    Social History   Socioeconomic History  . Marital status: Widowed    Spouse name: Not on file  . Number of children: 4  . Years of education: 6912  . Highest education level: Not on file  Social Needs  . Financial resource strain: Not on file  . Food insecurity - worry: Not on file  . Food insecurity - inability: Not on file  . Transportation needs - medical: Not on file  . Transportation needs - non-medical: Not on file  Occupational History  . Not on file  Tobacco Use  . Smoking status: Never Smoker  . Smokeless tobacco: Never Used  Substance and Sexual Activity  . Alcohol use:  No  . Drug use: No  . Sexual activity: Not on file  Other Topics Concern  . Not on file  Social History Narrative   Lives: with son, Caitlyn Lynch   Single, 4 children   Right handed   12 th grade   No caffeine    Allergies  Allergen Reactions  . Memantine Other (See Comments)    hallucinations    Family History  Problem Relation Age of Onset  . Cancer Father        Bladder  . Diabetes Son   . Alzheimer's disease Sister     Prior to Admission medications   Medication Sig Start Date End Date Taking? Authorizing Provider  allopurinol (ZYLOPRIM) 100 MG tablet Take 100 mg by mouth daily.    [provider]  AMLODIPINE BESYLATE PO Take 5 mg by mouth.     [provider]  apixaban (ELIQUIS) 5 MG TABS tablet Take 5 mg by mouth 2 (two) times daily.    [provider]  atenolol (TENORMIN) 100 MG tablet Take 100 mg by mouth daily.    [provider]  Clopidogrel Bisulfate (PLAVIX PO) Take 75 mg by mouth daily.     [provider]  donepezil (ARICEPT) 10 MG tablet Take 1 tablet (10 mg total) by mouth 2 (two) times daily. Patient taking differently: Take 20 mg by mouth at bedtime.  06/29/15   Anson Fret, MD  losartan-hydrochlorothiazide [facility]) 100-12.5 MG tablet  11/14/15   [provider]  nitroGLYCERIN (NITROSTAT) 0.4 MG SL tablet Place 0.4 mg under the tongue every 5 (five) minutes as needed for chest pain.    [provider]  pantoprazole (PROTONIX) 20 MG tablet  11/19/15   [provider]  pramipexole (MIRAPEX) 0.25 MG tablet  11/23/15   [provider]  Simvastatin (ZOCOR PO) Take 20 mg by mouth daily.     [provider]  traMADol (ULTRAM) 50 MG tablet Take 50 mg by mouth every 6 (six) hours as needed.    [provider]    Physical Exam: Vitals:   05/01/17 1510 05/01/17 1515 05/01/17 1530 05/01/17 1555  BP:    (!) 98/32  Pulse:  61 (!) 48 71  Resp:  (!) 23 13 (!) 29  Temp: (!) 92 F (33.3 C)   (!) 94 F (34.4 C)  TempSrc: Rectal   Rectal  SpO2:  94% 94% 97%     General:  Appears calm and comfortable Eyes:  PERRL, EOMI, normal lids, iris  No eye contact ENT: Neck:  no LAD, masses or thyromegaly Cardiovascular:  RRR, no m/r/g. No LE edema.  Respiratory: CTA bilaterally, no w/r/r. Normal respiratory effort. Abdomen:  soft, ntnd, NABS Skin:  no rash or induration seen on limited exam Musculoskeletal: no bony abnormality Psychiatric: responds only to pain Neurologic: nonresponsive   Labs on Admission: I have personally reviewed following labs and imaging studies  CBC: Recent Labs  Lab 05/01/17 1311  WBC 9.8  HGB 9.9*  HCT 30.4*  MCV 91.3  PLT 228   Basic Metabolic Panel: Recent Labs  Lab 05/01/17 1311  NA 138  K 4.1  CL 108  CO2 21*    GLUCOSE 100*  BUN 56*  CREATININE 1.72*  CALCIUM 9.5   GFR: CrCl cannot be calculated (Unknown ideal weight.). Liver Function Tests: No results for input(s): AST, ALT, ALKPHOS, BILITOT, PROT, ALBUMIN in the last 168 hours. No results for input(s): LIPASE, AMYLASE in  the last 168 hours. No results for input(s): AMMONIA in the last 168 hours. Coagulation Profile: Recent Labs  Lab 05/01/17 1311  INR 1.28   Cardiac Enzymes: No results for input(s): CKTOTAL, CKMB, CKMBINDEX, TROPONINI in the last 168 hours. BNP (last 3 results) No results for input(s): PROBNP in the last 8760 hours. HbA1C: No results for input(s): HGBA1C in the last 72 hours. CBG: Recent Labs  Lab 05/01/17 1304  GLUCAP 100*   Lipid Profile: No results for input(s): CHOL, HDL, LDLCALC, TRIG, CHOLHDL, LDLDIRECT in the last 72 hours. Thyroid Function Tests: No results for input(s): TSH, T4TOTAL, FREET4, T3FREE, THYROIDAB in the last 72 hours. Anemia Panel: No results for input(s): VITAMINB12, FOLATE, FERRITIN, TIBC, IRON, RETICCTPCT in the last 72 hours. Urine analysis:    Component Value Date/Time   COLORURINE YELLOW 05/01/2017 1403   APPEARANCEUR CLEAR 05/01/2017 1403   LABSPEC 1.014 05/01/2017 1403   PHURINE 5.0 05/01/2017 1403   GLUCOSEU NEGATIVE 05/01/2017 1403   HGBUR NEGATIVE 05/01/2017 1403   BILIRUBINUR NEGATIVE 05/01/2017 1403   KETONESUR NEGATIVE 05/01/2017 1403   PROTEINUR NEGATIVE 05/01/2017 1403   NITRITE NEGATIVE 05/01/2017 1403   LEUKOCYTESUR NEGATIVE 05/01/2017 1403    Creatinine Clearance: CrCl cannot be calculated (Unknown ideal weight.).  Sepsis Labs: @LABRCNTIP (procalcitonin:4,lacticidven:4) )No results found for this or any previous visit (from the past 240 hour(s)).   Radiological Exams on Admission: Dg Chest 2 View  Result Date: 05/01/2017 CLINICAL DATA:  Status post suspected fall. EXAM: CHEST  2 VIEW COMPARISON:  05/29/2016 FINDINGS: Mildly enlarged cardiac silhouette.  Calcific atherosclerotic disease and tortuosity of the aorta. Mediastinal contours appear intact. There is no evidence of pneumothorax or focal consolidation. Increased interstitial markings. Osseous structures are without acute abnormality. Soft tissues are grossly normal. IMPRESSION: Enlarged cardiac silhouette with mild pulmonary vascular congestion. Electronically Signed   By: Ted Mcalpine M.D.   On: 05/01/2017 14:01   Ct Head Wo Contrast  Result Date: 05/01/2017 CLINICAL DATA:  Unwitnessed fall. Found in bathtub. Left posterior head injury. EXAM: CT HEAD WITHOUT CONTRAST CT CERVICAL SPINE WITHOUT CONTRAST TECHNIQUE: Multidetector CT imaging of the head and cervical spine was performed following the standard protocol without intravenous contrast. Multiplanar CT image reconstructions of the cervical spine were also generated. COMPARISON:  10/31/2015 brain MRI. FINDINGS: CT HEAD FINDINGS Brain: No evidence of parenchymal hemorrhage or extra-axial fluid collection. No mass lesion, mass effect, or midline shift. No CT evidence of acute infarction. Nonspecific mild to moderate subcortical and periventricular white matter hypodensity, most in keeping with chronic small vessel ischemic change. Cerebral volume is age appropriate. No ventriculomegaly. Vascular: No acute abnormality. Skull: No evidence of calvarial fracture. Sinuses/Orbits: Tiny fluid level in the left sphenoid sinus. Otherwise clear visualized paranasal sinuses. Other: Small to moderate left posterior scalp hematoma. Postsurgical changes are present in the right mastoid. The left mastoid air cells are unopacified. CT CERVICAL SPINE FINDINGS Alignment: Straightening of the cervical spine. No subluxation. Dens is well positioned between the lateral masses of C1. Skull base and vertebrae: No acute fracture. No primary bone lesion or focal pathologic process. Soft tissues and spinal canal: No prevertebral fluid or swelling. No visible canal  hematoma. Disc levels: Moderate multilevel degenerative disc disease in the mid to lower cervical spine, most prominent at C5-6. Advanced bilateral facet arthropathy, asymmetrically prominent on the left. No significant degenerative foraminal stenosis. Upper chest: Apical right upper lobe 5 mm solid pulmonary nodule (series 9/ image 68). Other: Clear left mastoid air cells.  Postsurgical changes in the right mastoid. No discrete thyroid nodules. No pathologically enlarged cervical nodes. IMPRESSION: 1. Small to moderate left posterior scalp hematoma. 2. No evidence of acute intracranial abnormality. No evidence of calvarial fracture. 3. No cervical spine fracture or subluxation. 4. Mild-to-moderate chronic small vessel ischemic change in the cerebral white matter. 5. Tiny fluid level in the left sphenoid sinus, cannot exclude mild acute sinusitis. 6. Moderate degenerative changes in the cervical spine as detailed. 7. Apical right upper lobe 5 mm solid pulmonary nodule. No follow-up needed if patient is low-risk. Non-contrast chest CT can be considered in 12 months if patient is high-risk. This recommendation follows the consensus statement: Guidelines for Management of Incidental Pulmonary Nodules Detected on CT Images: From the Fleischner Society 2017; Radiology 2017; 284:228-243. Electronically Signed   By: Delbert Phenix M.D.   On: 05/01/2017 13:53   Ct Cervical Spine Wo Contrast  Result Date: 05/01/2017 CLINICAL DATA:  Unwitnessed fall. Found in bathtub. Left posterior head injury. EXAM: CT HEAD WITHOUT CONTRAST CT CERVICAL SPINE WITHOUT CONTRAST TECHNIQUE: Multidetector CT imaging of the head and cervical spine was performed following the standard protocol without intravenous contrast. Multiplanar CT image reconstructions of the cervical spine were also generated. COMPARISON:  10/31/2015 brain MRI. FINDINGS: CT HEAD FINDINGS Brain: No evidence of parenchymal hemorrhage or extra-axial fluid collection. No mass  lesion, mass effect, or midline shift. No CT evidence of acute infarction. Nonspecific mild to moderate subcortical and periventricular white matter hypodensity, most in keeping with chronic small vessel ischemic change. Cerebral volume is age appropriate. No ventriculomegaly. Vascular: No acute abnormality. Skull: No evidence of calvarial fracture. Sinuses/Orbits: Tiny fluid level in the left sphenoid sinus. Otherwise clear visualized paranasal sinuses. Other: Small to moderate left posterior scalp hematoma. Postsurgical changes are present in the right mastoid. The left mastoid air cells are unopacified. CT CERVICAL SPINE FINDINGS Alignment: Straightening of the cervical spine. No subluxation. Dens is well positioned between the lateral masses of C1. Skull base and vertebrae: No acute fracture. No primary bone lesion or focal pathologic process. Soft tissues and spinal canal: No prevertebral fluid or swelling. No visible canal hematoma. Disc levels: Moderate multilevel degenerative disc disease in the mid to lower cervical spine, most prominent at C5-6. Advanced bilateral facet arthropathy, asymmetrically prominent on the left. No significant degenerative foraminal stenosis. Upper chest: Apical right upper lobe 5 mm solid pulmonary nodule (series 9/ image 68). Other: Clear left mastoid air cells. Postsurgical changes in the right mastoid. No discrete thyroid nodules. No pathologically enlarged cervical nodes. IMPRESSION: 1. Small to moderate left posterior scalp hematoma. 2. No evidence of acute intracranial abnormality. No evidence of calvarial fracture. 3. No cervical spine fracture or subluxation. 4. Mild-to-moderate chronic small vessel ischemic change in the cerebral white matter. 5. Tiny fluid level in the left sphenoid sinus, cannot exclude mild acute sinusitis. 6. Moderate degenerative changes in the cervical spine as detailed. 7. Apical right upper lobe 5 mm solid pulmonary nodule. No follow-up needed if  patient is low-risk. Non-contrast chest CT can be considered in 12 months if patient is high-risk. This recommendation follows the consensus statement: Guidelines for Management of Incidental Pulmonary Nodules Detected on CT Images: From the Fleischner Society 2017; Radiology 2017; 284:228-243. Electronically Signed   By: Delbert Phenix M.D.   On: 05/01/2017 13:53    EKG: Independently reviewed.   Assessment/Plan   1) Hypothermia --Continue Bear hugger --TSH pending  --Critical care consulted.  Dr. Wallace Cullens advised Ct of abdomen.  He advised repeat Ct of her head in the am.  2)Altered mental status/Fall --Repeat head ct in am for possible cva  3) Hypotension -- Iv fluids bolus     DVT prophylaxis: none Code Status: no code  Family Communication: Son at bedside  Disposition Plan: admit Consults called: Critical care evaluated and advised Ct abdomen, Repeat head Ct in am.  Admission status: Inpatient    Langston MaskerKaren Shavon Zenz PA-C [facility]  If 7PM-7AM, please contact night-coverage [email or web address] Password [email or web address]  05/01/2017, 5:02 PM

## 2017-05-01 NOTE — ED Notes (Signed)
Pt placed on bear hugger.  

## 2017-05-01 NOTE — Consult Note (Signed)
PULMONARY / CRITICAL CARE MEDICINE   Name: Caitlyn Lynch MRN: [id number] DOB: 1931-01-04    ADMISSION DATE:  05/01/2017 CONSULTATION DATE: 05/01/2017  REFERRING MD:  [facility]  CHIEF COMPLAINT: Hypothermia following an unwitnessed fall.     HISTORY OF PRESENT ILLNESS:   At baseline this is an 83 year old with advanced dementia.  She does not recognize family members and spends most of her day sitting on the toilet.  She went to the bathroom earlier today was heard to fall and was found in the bathtub.  At baseline her dementia is sufficiently severe that she does not recognize family members.  Heard to fall in the bathroom to day and was found within a minute in the bathtub with a knot on her head.  She was brought to our department of emergency medicine where CT scan of the head was unremarkable. She was found to be hypothermic, with no obvious source of infection.  The chest x-ray was negative and urinalysis was negative.  Family denies that she had an acute illness prior to her fall.  Specifically she had no cough fevers chills or sweats, no abdominal pain, no unusual complaints.  There is no history of thyroid disease or use of steroids. PAST MEDICAL HISTORY :  She  has a past medical history of Arthropathy, unspecified, site unspecified, Atrial fibrillation (HCC), Body mass index 28.0-28.9, adult, CKD (chronic kidney disease), Contact with or exposure to other viral diseases(V01.79), Coronary artery disease, Dementia, Encounter for long-term (current) use of other medications, Essential hypertension, benign, Gout, unspecified, Memory loss, Migraine, unspecified, without mention of intractable migraine without mention of status migrainosus, Other and unspecified hyperlipidemia, Other cells and casts in urine, Other ear problems, Other specified noninflammatory disorder of vagina, Pain in joint, shoulder region, Personal history of other disorders of nervous system and sense organs, Personal history of  urinary (tract) infection, and Subclavian steal syndrome.  PAST SURGICAL HISTORY: She  has a past surgical history that includes Tonsillectomy (1945) and External ear surgery.  Allergies  Allergen Reactions  . Memantine Other (See Comments)    hallucinations    No current facility-administered medications on file prior to encounter.    Current Outpatient Medications on File Prior to Encounter  Medication Sig  . allopurinol (ZYLOPRIM) 100 MG tablet Take 100 mg by mouth daily.  Marland Kitchen. AMLODIPINE BESYLATE PO Take 5 mg by mouth.   Marland Kitchen. apixaban (ELIQUIS) 5 MG TABS tablet Take 5 mg by mouth 2 (two) times daily.  Marland Kitchen. atenolol (TENORMIN) 100 MG tablet Take 100 mg by mouth daily.  Marland Kitchen. Clopidogrel Bisulfate (PLAVIX PO) Take 75 mg by mouth daily.   Marland Kitchen. donepezil (ARICEPT) 10 MG tablet Take 1 tablet (10 mg total) by mouth 2 (two) times daily. (Patient taking differently: Take 20 mg by mouth at bedtime. )  . losartan-hydrochlorothiazide (HYZAAR) 100-12.5 MG tablet   . nitroGLYCERIN (NITROSTAT) 0.4 MG SL tablet Place 0.4 mg under the tongue every 5 (five) minutes as needed for chest pain.  . pantoprazole (PROTONIX) 20 MG tablet   . pramipexole (MIRAPEX) 0.25 MG tablet   . Simvastatin (ZOCOR PO) Take 20 mg by mouth daily.   . traMADol (ULTRAM) 50 MG tablet Take 50 mg by mouth every 6 (six) hours as needed.  . [DISCONTINUED] lisinopril-hydrochlorothiazide (PRINZIDE,ZESTORETIC) 20-12.5 MG per tablet Take 1 tablet by mouth daily.  . [DISCONTINUED] warfarin (COUMADIN) 2.5 MG tablet Take 2.5 mg by mouth 2 (two) times a week. Taking 1 daily PO, 1/2 on Tuesday  and Thursday    FAMILY HISTORY:  Her indicated that her mother is deceased. She indicated that her father is deceased. She indicated that the status of her sister is unknown. She indicated that the status of her son is unknown.   SOCIAL HISTORY: She  reports that  has never smoked. she has never used smokeless tobacco. She reports that she does not drink  alcohol or use drugs.  REVIEW OF SYSTEMS:   View of systems shows that her day is spent walking back and forth to the bathroom where she spends approximately 18 hours a day.  As noted her dementia is sufficiently severe that she is not recognizing some family members.  They say that she had hallucinations in the past and suspect she still has them but does not speak of them.  He has no past history of respiratory illness she does have a significant cardiovascular history with a subclavian steal, atrial fibrillation, CHF, and stents for coronary artery disease.  She also has an unexplained history of anemia for which her dose of Eliquis was this decreased approximately a year ago. as noted she has no history of thyroid or adrenal disease.  Skin breakdown is denied  SUBJECTIVE:  The patient is completely nonverbal.  VITAL SIGNS: BP (!) 98/32 (BP Location: Right Arm)   Pulse 71   Temp (!) 94 F (34.4 C) (Rectal)   Resp (!) 29   SpO2 97%   HEMODYNAMICS:    VENTILATOR SETTINGS:    INTAKE / OUTPUT: No intake/output data recorded.  PHYSICAL EXAMINATION: General: Is a frail chronically ill-appearing 83 year old. Neuro: Does not respond to voice.  She does say ouch in response to nailbed pressure and withdraws both upper extremities left more vigorously than right.  She does not cross the midline with either eye looking to the right.  Pupils are equal. Cardiovascular: S1 and S2 are regular with a 3 out of 6 systolic ejection murmur.  She has 2+ dependent edema. Lungs: Respirations are unlabored, there are no wheezes. Abdomen: Abdominal exam is difficult due to her mental status. There may be some reproducible right sided tenderness but no overt mass guarding or rebound.  LABS:  BMET Recent Labs  Lab 05/01/17 1311  NA 138  K 4.1  CL 108  CO2 21*  BUN 56*  CREATININE 1.72*  GLUCOSE 100*    Electrolytes Recent Labs  Lab 05/01/17 1311  CALCIUM 9.5    CBC Recent Labs  Lab  05/01/17 1311  WBC 9.8  HGB 9.9*  HCT 30.4*  PLT 228    Coag's Recent Labs  Lab 05/01/17 1311  INR 1.28    Sepsis Markers No results for input(s): LATICACIDVEN, PROCALCITON, O2SATVEN in the last 168 hours.  ABG No results for input(s): PHART, PCO2ART, PO2ART in the last 168 hours.  Liver Enzymes No results for input(s): AST, ALT, ALKPHOS, BILITOT, ALBUMIN in the last 168 hours.  Cardiac Enzymes No results for input(s): TROPONINI, PROBNP in the last 168 hours.  Glucose Recent Labs  Lab 05/01/17 1304  GLUCAP 100*    Imaging Dg Chest 2 View  Result Date: 05/01/2017 CLINICAL DATA:  Status post suspected fall. EXAM: CHEST  2 VIEW COMPARISON:  05/29/2016 FINDINGS: Mildly enlarged cardiac silhouette. Calcific atherosclerotic disease and tortuosity of the aorta. Mediastinal contours appear intact. There is no evidence of pneumothorax or focal consolidation. Increased interstitial markings. Osseous structures are without acute abnormality. Soft tissues are grossly normal. IMPRESSION: Enlarged cardiac silhouette with mild pulmonary  vascular congestion. Electronically Signed   By: Ted Mcalpine M.D.   On: 05/01/2017 14:01   Ct Head Wo Contrast  Result Date: 05/01/2017 CLINICAL DATA:  Unwitnessed fall. Found in bathtub. Left posterior head injury. EXAM: CT HEAD WITHOUT CONTRAST CT CERVICAL SPINE WITHOUT CONTRAST TECHNIQUE: Multidetector CT imaging of the head and cervical spine was performed following the standard protocol without intravenous contrast. Multiplanar CT image reconstructions of the cervical spine were also generated. COMPARISON:  10/31/2015 brain MRI. FINDINGS: CT HEAD FINDINGS Brain: No evidence of parenchymal hemorrhage or extra-axial fluid collection. No mass lesion, mass effect, or midline shift. No CT evidence of acute infarction. Nonspecific mild to moderate subcortical and periventricular white matter hypodensity, most in keeping with chronic small vessel  ischemic change. Cerebral volume is age appropriate. No ventriculomegaly. Vascular: No acute abnormality. Skull: No evidence of calvarial fracture. Sinuses/Orbits: Tiny fluid level in the left sphenoid sinus. Otherwise clear visualized paranasal sinuses. Other: Small to moderate left posterior scalp hematoma. Postsurgical changes are present in the right mastoid. The left mastoid air cells are unopacified. CT CERVICAL SPINE FINDINGS Alignment: Straightening of the cervical spine. No subluxation. Dens is well positioned between the lateral masses of C1. Skull base and vertebrae: No acute fracture. No primary bone lesion or focal pathologic process. Soft tissues and spinal canal: No prevertebral fluid or swelling. No visible canal hematoma. Disc levels: Moderate multilevel degenerative disc disease in the mid to lower cervical spine, most prominent at C5-6. Advanced bilateral facet arthropathy, asymmetrically prominent on the left. No significant degenerative foraminal stenosis. Upper chest: Apical right upper lobe 5 mm solid pulmonary nodule (series 9/ image 68). Other: Clear left mastoid air cells. Postsurgical changes in the right mastoid. No discrete thyroid nodules. No pathologically enlarged cervical nodes. IMPRESSION: 1. Small to moderate left posterior scalp hematoma. 2. No evidence of acute intracranial abnormality. No evidence of calvarial fracture. 3. No cervical spine fracture or subluxation. 4. Mild-to-moderate chronic small vessel ischemic change in the cerebral white matter. 5. Tiny fluid level in the left sphenoid sinus, cannot exclude mild acute sinusitis. 6. Moderate degenerative changes in the cervical spine as detailed. 7. Apical right upper lobe 5 mm solid pulmonary nodule. No follow-up needed if patient is low-risk. Non-contrast chest CT can be considered in 12 months if patient is high-risk. This recommendation follows the consensus statement: Guidelines for Management of Incidental Pulmonary  Nodules Detected on CT Images: From the Fleischner Society 2017; Radiology 2017; 284:228-243. Electronically Signed   By: Delbert Phenix M.D.   On: 05/01/2017 13:53   Ct Cervical Spine Wo Contrast  Result Date: 05/01/2017 CLINICAL DATA:  Unwitnessed fall. Found in bathtub. Left posterior head injury. EXAM: CT HEAD WITHOUT CONTRAST CT CERVICAL SPINE WITHOUT CONTRAST TECHNIQUE: Multidetector CT imaging of the head and cervical spine was performed following the standard protocol without intravenous contrast. Multiplanar CT image reconstructions of the cervical spine were also generated. COMPARISON:  10/31/2015 brain MRI. FINDINGS: CT HEAD FINDINGS Brain: No evidence of parenchymal hemorrhage or extra-axial fluid collection. No mass lesion, mass effect, or midline shift. No CT evidence of acute infarction. Nonspecific mild to moderate subcortical and periventricular white matter hypodensity, most in keeping with chronic small vessel ischemic change. Cerebral volume is age appropriate. No ventriculomegaly. Vascular: No acute abnormality. Skull: No evidence of calvarial fracture. Sinuses/Orbits: Tiny fluid level in the left sphenoid sinus. Otherwise clear visualized paranasal sinuses. Other: Small to moderate left posterior scalp hematoma. Postsurgical changes are present in the right mastoid.  The left mastoid air cells are unopacified. CT CERVICAL SPINE FINDINGS Alignment: Straightening of the cervical spine. No subluxation. Dens is well positioned between the lateral masses of C1. Skull base and vertebrae: No acute fracture. No primary bone lesion or focal pathologic process. Soft tissues and spinal canal: No prevertebral fluid or swelling. No visible canal hematoma. Disc levels: Moderate multilevel degenerative disc disease in the mid to lower cervical spine, most prominent at C5-6. Advanced bilateral facet arthropathy, asymmetrically prominent on the left. No significant degenerative foraminal stenosis. Upper  chest: Apical right upper lobe 5 mm solid pulmonary nodule (series 9/ image 68). Other: Clear left mastoid air cells. Postsurgical changes in the right mastoid. No discrete thyroid nodules. No pathologically enlarged cervical nodes. IMPRESSION: 1. Small to moderate left posterior scalp hematoma. 2. No evidence of acute intracranial abnormality. No evidence of calvarial fracture. 3. No cervical spine fracture or subluxation. 4. Mild-to-moderate chronic small vessel ischemic change in the cerebral white matter. 5. Tiny fluid level in the left sphenoid sinus, cannot exclude mild acute sinusitis. 6. Moderate degenerative changes in the cervical spine as detailed. 7. Apical right upper lobe 5 mm solid pulmonary nodule. No follow-up needed if patient is low-risk. Non-contrast chest CT can be considered in 12 months if patient is high-risk. This recommendation follows the consensus statement: Guidelines for Management of Incidental Pulmonary Nodules Detected on CT Images: From the Fleischner Society 2017; Radiology 2017; 284:228-243. Electronically Signed   By: Delbert PhenixJason A Poff M.D.   On: 05/01/2017 13:53     STUDIES:  CT scan of the head did not show an acute event, chest x-ray is remarkable for mild CHF  DISCUSSION: This is an 83 year old with advanced dementia who fell in the bathtub earlier today.  She was brought to the department of emergency medicine and found to be hypothermic.  Her urine was clean and there was no obvious infiltrate on the chest x-ray.  Did not have any significant time of exposure to the cold.  Does not have a known history of thyroid disease nor has she been on steroids to suggest relative adrenal insufficiency.  I discussed the situation with family who describe a very advanced dementia.  Shared with them that the usual causes of hypothermia are severe infections, exposure to a cold environment, and endocrine disorders.  Advised him that I am in intensive care physician and that I could  provide life support measures including mechanical ventilation and drugs to support blood pressure however after discussion with them they do not feel that those would be appropriate supportive interventions.  They would like for their mother to be treated with empiric antibiotics and fluids.  I have suggested a CT scan of the abdomen to look for a source of sepsis more to wish sure the family that we have found the etiology of her problem then to look for a problem for which we are going to intervene.  I have also suggested a repeat CT scan in 12 or more hours as she has focal neurological findings on my exam.  Again the purpose of this exam is not to seek problems for which we are to intervene but to look for findings of will make it easier for family to make decisions about appropriate levels of care.  ASSESSMENT / PLAN:  PULMONARY A: Appears to have no active respiratory disease at this time.  She does not have an infiltrate on chest x-ray family denies a history of cough prior to her fall this  morning.   CARDIOVASCULAR A: Does have a significant history of CAD as well as atrial fibrillation intermittently.  I would suggest holding her beta-blocker for now, Although her bradycardia is likely secondary to her hypothermia, certainly conceivable that a low cardiac output state related to over aggressive beta-blockade is contributing to her hypothermia.  RENAL A: She has chronic renal insufficiency   GASTROINTESTINAL A: She has some right sided abdominal tenderness and a CT scan of the abdomen is suggested especially as diverticulitis might explain the constellation of findings.   HEMATOLOGIC A: Although she does not have a leukocytosis a debilitated 83 year old may not generate one even in the presence of significant infection.   ENDOCRINE A: TSH is normal.  I would suggest getting a random cortisol and if it is not above 20 recommend empirically treating for relative adrenal  insufficiency.   NEUROLOGIC A: I think she has focal findings on my examination specifically the eyes do not cross to the right.  Just a follow-up CT scan tomorrow.  Family has made it clear they do not desire critical care interventions, please reconsult [place] should the situation change. Greater than 35 minutes was spent in the care of this patient today  Penny Pia, MD Pulmonary and Critical Care Medicine [facility] Pager: [phone]  05/01/2017, 4:34 PM

## 2017-05-02 ENCOUNTER — Inpatient Hospital Stay (HOSPITAL_COMMUNITY): Payer: Medicare HMO

## 2017-05-02 DIAGNOSIS — T68XXXA Hypothermia, initial encounter: Secondary | ICD-10-CM

## 2017-05-02 DIAGNOSIS — I959 Hypotension, unspecified: Secondary | ICD-10-CM

## 2017-05-02 DIAGNOSIS — R001 Bradycardia, unspecified: Secondary | ICD-10-CM

## 2017-05-02 LAB — GLUCOSE, CAPILLARY
GLUCOSE-CAPILLARY: 115 mg/dL — AB (ref 65–99)
Glucose-Capillary: 66 mg/dL (ref 65–99)

## 2017-05-02 LAB — COMPREHENSIVE METABOLIC PANEL
ALK PHOS: 101 U/L (ref 38–126)
ALT: 43 U/L (ref 14–54)
AST: 120 U/L — AB (ref 15–41)
Albumin: 3.1 g/dL — ABNORMAL LOW (ref 3.5–5.0)
Anion gap: 9 (ref 5–15)
BUN: 54 mg/dL — AB (ref 6–20)
CALCIUM: 9 mg/dL (ref 8.9–10.3)
CO2: 20 mmol/L — ABNORMAL LOW (ref 22–32)
CREATININE: 2.05 mg/dL — AB (ref 0.44–1.00)
Chloride: 110 mmol/L (ref 101–111)
GFR calc non Af Amer: 21 mL/min — ABNORMAL LOW (ref >60)
GFR, EST AFRICAN AMERICAN: 24 mL/min — AB (ref >60)
Glucose, Bld: 74 mg/dL (ref 65–99)
Potassium: 4.8 mmol/L (ref 3.5–5.1)
Sodium: 139 mmol/L (ref 135–145)
Total Bilirubin: 0.6 mg/dL (ref 0.3–1.2)
Total Protein: 6.4 g/dL — ABNORMAL LOW (ref 6.5–8.1)

## 2017-05-02 LAB — URINE CULTURE: Culture: NO GROWTH

## 2017-05-02 LAB — CBC
HCT: 29.3 % — ABNORMAL LOW (ref 36.0–46.0)
Hemoglobin: 9.5 g/dL — ABNORMAL LOW (ref 12.0–15.0)
MCH: 29.6 pg (ref 26.0–34.0)
MCHC: 32.4 g/dL (ref 30.0–36.0)
MCV: 91.3 fL (ref 78.0–100.0)
PLATELETS: 239 10*3/uL (ref 150–400)
RBC: 3.21 MIL/uL — AB (ref 3.87–5.11)
RDW: 13.8 % (ref 11.5–15.5)
WBC: 8.2 10*3/uL (ref 4.0–10.5)

## 2017-05-02 MED ORDER — DONEPEZIL HCL 10 MG PO TABS: 10.0000 mg | ORAL_TABLET | Freq: Two times a day (BID) | ORAL | Status: DC

## 2017-05-02 MED ORDER — INSULIN ASPART 100 UNIT/ML ~~LOC~~ SOLN: 0.0000 [IU] | Freq: Three times a day (TID) | SUBCUTANEOUS | Status: DC

## 2017-05-02 MED ORDER — APIXABAN 5 MG PO TABS
5.0000 mg | ORAL_TABLET | Freq: Two times a day (BID) | ORAL | Status: DC
  Administered 2017-05-02 – 2017-05-03 (×2): 5 mg via ORAL
  Filled 2017-05-02 (×2): qty 1

## 2017-05-02 MED ORDER — DONEPEZIL HCL 10 MG PO TABS
10.0000 mg | ORAL_TABLET | Freq: Every day | ORAL | Status: DC
  Administered 2017-05-02: 10 mg via ORAL
  Filled 2017-05-02: qty 2

## 2017-05-02 MED ORDER — PANTOPRAZOLE SODIUM 20 MG PO TBEC
20.0000 mg | DELAYED_RELEASE_TABLET | Freq: Every day | ORAL | Status: DC
  Administered 2017-05-02 – 2017-05-03 (×2): 20 mg via ORAL
  Filled 2017-05-02 (×2): qty 1

## 2017-05-02 MED ORDER — WHITE PETROLATUM EX OINT
TOPICAL_OINTMENT | CUTANEOUS | Status: DC | PRN
  Filled 2017-05-02: qty 28.35

## 2017-05-02 NOTE — Progress Notes (Signed)
PROGRESS NOTE  Caitlyn Lynch:[id number] DOB: 01/02/1931 DOA: 05/01/2017 PCP: Iona HansenJones, Penny L, NP  HPI/Recap of past 24 hours: HPI from Cheron SchaumannSofia Leslie, PA-C on 05/01/17 Caitlyn Lynch is a 83 y.o. female with medical history significant of advanced dementia, Atrial fibrillation, CAD, Chronic renal disease. Pt lives with her family.  Pt's family reports pt paces between bathroom and her bedroom.  Pt's Daughter in law noticed pt went to the bathroom but did not come out for about 15 minutes.  When she went to check on pt she had fallen and hit her head on the bathtub. Son reports pt was breathing heavy. Family reports pt has recently had a decrease in her appetite.  She has been giving her food to the dog. Pt has had recent increasing confusion.  Pt has not had any recent illness. No cough, no uti symptoms, no diarrhea or vomiting.  Family reports they do not want any heroic measures. In the ED, pt was noted to be hypothermic, temp around 91, bradycardic and hypotensive. CT head done was negative. Bear hugger was placed. Dr [facility]. Pt admitted for further management.  Today, pt noted to be alert, awake, not oriented. VSS stable. Pt unable to answer questions correctly. Looked comfortable, not in distress. Pt noted to bite her lower lip with bleeding, which is a chronic thing for pt as per son.   Assessment/Plan: Active Problems:   Altered mental state  #Acute metabolic encephalopathy Stable Unclear etiology, Sepsis/infection Vs CVA Vs Uremia Vs Medication induced Vs worsening dementia Hypothermic/hypotensive on admission, no signs of infection No leukocytosis  BC X2 pending UA neg, UC no growth CXR: Enlarged cardiac silhouette with mild pulmonary vascular congestion. CT head X 2, no acute abnormality CT Abd/pelvis: No acute abnormality Continue IVF, will hold off starting antibiotics as no source  Will consider MRI head if patient symptoms  worsens SLP/PT/OT  #Hypothermia: Improving Work up as above, no signs of sepsis Keep warm, bear hugger prn  #Hypotensive/Bradycardia Improving Work as above, no signs of sepsis Likely medication induced in the setting of poor oral intake: Pt on losartan-HCT 100-12.5, amlodipine 5mg , atenolol 100mg , lasix 20mg  Will hold off all meds for now  #AKI on CKD stage III Unknown baseline IVF Daily BMP  #Chronic Afib Rate controlled EKG showed sinus brady Hold atenolol, continue eliquis  #HTN: Due to recent hypotension, will hold off losartan-HCT 100-12.5, amlodipine 5mg , atenolol 100mg , lasix 20mg    #CAD Chest pain free On plavix, will hold for now Hold BB, CCB, ACE  #Advanced Dementia Worsening, son unable to care for pt Palliative consult to discuss GOC Continue Aricept  #Restless leg Hold pramipexole  #HLD Hold zocor 20mg  daily  #GERD Continue pantoprazole  Code Status: DNR  Family Communication: Spoke with son, wants palliative consult. Unable to take care of pt   Disposition Plan: Pending PT/OT, palliative consult    Consultants:  PCCM  Procedures:  NOne  Antimicrobials:  None  DVT prophylaxis:  Apixiban   Objective: Vitals:   05/01/17 1822 05/01/17 2258 05/02/17 0523 05/02/17 1349  BP: (!) 125/51 (!) 137/46 (!) 110/39 (!) 150/57  Pulse: 90 73 75 90  Resp: (!) 24 16 20 20   Temp:  (!) 97.4 F (36.3 C) 97.7 F (36.5 C) 98.4 F (36.9 C)  TempSrc:    Oral  SpO2: 96% 97% 94% 96%    Intake/Output Summary (Last 24 hours) at 05/02/2017 1920 Last data filed  at 05/02/2017 1900 Gross per 24 hour  Intake 2812.92 ml  Output -  Net 2812.92 ml   There were no vitals filed for this visit.  Exam:   General:  Alert, awake, not oriented,   Cardiovascular: S1-S2 noted, no added hrt sound  Respiratory: Chest clear bilaterally  Abdomen: Soft, non-tender, non distended, BS present  Musculoskeletal: No pedal edema   Skin:  Normal  Psychiatry: Unable to assess   Data Reviewed: CBC: Recent Labs  Lab 05/01/17 1311 05/02/17 0343  WBC 9.8 8.2  HGB 9.9* 9.5*  HCT 30.4* 29.3*  MCV 91.3 91.3  PLT 228 239   Basic Metabolic Panel: Recent Labs  Lab 05/01/17 1311 05/02/17 0343  NA 138 139  K 4.1 4.8  CL 108 110  CO2 21* 20*  GLUCOSE 100* 74  BUN 56* 54*  CREATININE 1.72* 2.05*  CALCIUM 9.5 9.0   GFR: CrCl cannot be calculated (Unknown ideal weight.). Liver Function Tests: Recent Labs  Lab 05/02/17 0343  AST 120*  ALT 43  ALKPHOS 101  BILITOT 0.6  PROT 6.4*  ALBUMIN 3.1*   No results for input(s): LIPASE, AMYLASE in the last 168 hours. No results for input(s): AMMONIA in the last 168 hours. Coagulation Profile: Recent Labs  Lab 05/01/17 1311  INR 1.28   Cardiac Enzymes: No results for input(s): CKTOTAL, CKMB, CKMBINDEX, TROPONINI in the last 168 hours. BNP (last 3 results) No results for input(s): PROBNP in the last 8760 hours. HbA1C: No results for input(s): HGBA1C in the last 72 hours. CBG: Recent Labs  Lab 05/01/17 1304  GLUCAP 100*   Lipid Profile: No results for input(s): CHOL, HDL, LDLCALC, TRIG, CHOLHDL, LDLDIRECT in the last 72 hours. Thyroid Function Tests: Recent Labs    05/01/17 1500  TSH 1.881   Anemia Panel: No results for input(s): VITAMINB12, FOLATE, FERRITIN, TIBC, IRON, RETICCTPCT in the last 72 hours. Urine analysis:    Component Value Date/Time   COLORURINE YELLOW 05/01/2017 1403   APPEARANCEUR CLEAR 05/01/2017 1403   LABSPEC 1.014 05/01/2017 1403   PHURINE 5.0 05/01/2017 1403   GLUCOSEU NEGATIVE 05/01/2017 1403   HGBUR NEGATIVE 05/01/2017 1403   BILIRUBINUR NEGATIVE 05/01/2017 1403   KETONESUR NEGATIVE 05/01/2017 1403   PROTEINUR NEGATIVE 05/01/2017 1403   NITRITE NEGATIVE 05/01/2017 1403   LEUKOCYTESUR NEGATIVE 05/01/2017 1403   Sepsis Labs: @LABRCNTIP (procalcitonin:4,lacticidven:4)  ) Recent Results (from the past 240 hour(s))  Urine  culture     Status: None   Collection Time: 05/01/17  2:20 PM  Result Value Ref Range Status   Specimen Description URINE, CLEAN CATCH  Final   Special Requests NONE  Final   Culture NO GROWTH  Final   Report Status 05/02/2017 FINAL  Final  Blood culture (routine x 2)     Status: None (Preliminary result)   Collection Time: 05/01/17  2:49 PM  Result Value Ref Range Status   Specimen Description BLOOD RIGHT ANTECUBITAL  Final   Special Requests IN PEDIATRIC BOTTLE Blood Culture adequate volume  Final   Culture NO GROWTH < 24 HOURS  Final   Report Status PENDING  Incomplete  Blood culture (routine x 2)     Status: None (Preliminary result)   Collection Time: 05/01/17  2:54 PM  Result Value Ref Range Status   Specimen Description BLOOD BLOOD RIGHT WRIST  Final   Special Requests   Final    BOTTLES DRAWN AEROBIC AND ANAEROBIC Blood Culture adequate volume   Culture NO GROWTH <  24 HOURS  Final   Report Status PENDING  Incomplete      Studies: Ct Abdomen Pelvis Wo Contrast  Result Date: 05/01/2017 CLINICAL DATA:  Abdominal pain and fever EXAM: CT ABDOMEN AND PELVIS WITHOUT CONTRAST TECHNIQUE: Multidetector CT imaging of the abdomen and pelvis was performed following the standard protocol without IV contrast. COMPARISON:  CT abdomen pelvis 01/29/2015 FINDINGS: Lower chest: There is cardiomegaly. Small right pleural effusion. Bilateral pulmonary septal thickening consistent with pulmonary edema. Coronary artery calcification. Hepatobiliary: Multiple calcified granulomata throughout the liver. Large stone in the dependent portion of the gallbladder. No biliary dilatation. No focal liver lesion. Pancreas: Normal contours without ductal dilatation. No peripancreatic fluid collection. Spleen: Numerous splenic granulomata. Adrenals/Urinary Tract: --Adrenal glands: Normal. --Right kidney/ureter: No hydronephrosis or perinephric stranding. No nephrolithiasis. No obstructing ureteral stones. Moderate  renal atrophy. --Left kidney/ureter: No hydronephrosis or perinephric stranding. No nephrolithiasis. No obstructing ureteral stones. Moderate renal atrophy. --Urinary bladder: Unremarkable. Stomach/Bowel: --Stomach/Duodenum: No hiatal hernia or other gastric abnormality. Normal duodenal course and caliber. --Small bowel: No dilatation or inflammation. --Colon: Rectosigmoid diverticulosis without acute inflammation. --Appendix: Normal. Vascular/Lymphatic: Atherosclerotic calcification is present within the non-aneurysmal abdominal aorta, without hemodynamically significant stenosis. No abdominal or pelvic lymphadenopathy. Reproductive: The appearance of the uterus is normal. There is a cystic lesion in the anterior left adnexa measuring 6.7 x 5.0 cm, decreased in size from the prior study. This has been present on studies as remote is 09/22/2013. Musculoskeletal. Multilevel degenerative disc disease and facet arthrosis. No bony spinal canal stenosis. Other: None. IMPRESSION: 1. No acute abdominopelvic abnormality. 2. Unchanged appearance of large calcified gallstone without evidence of acute cholecystitis. 3. Decreased size of left adnexal cystic lesion, present as far back as 09/22/2013. 4. Cardiomegaly with small right pleural effusion and bibasilar pulmonary edema. 5. Coronary artery and Aortic Atherosclerosis (ICD10-I70.0). Electronically Signed   By: Deatra Robinson M.D.   On: 05/01/2017 22:14   Ct Head Wo Contrast  Result Date: 05/02/2017 CLINICAL DATA:  Altered mental status EXAM: CT HEAD WITHOUT CONTRAST TECHNIQUE: Contiguous axial images were obtained from the base of the skull through the vertex without intravenous contrast. COMPARISON:  05/01/2017 FINDINGS: Brain: No evidence of acute infarction, hemorrhage, hydrocephalus, extra-axial collection or mass lesion/mass effect. Mild atrophic and ischemic changes are noted. Vascular: No hyperdense vessel or unexpected calcification. Skull: Normal. Negative  for fracture or focal lesion. Sinuses/Orbits: No acute finding. Other: Previously seen scalp hematoma is less well visualized. IMPRESSION: No acute intracranial abnormality noted. Chronic changes similar to that seen on the prior exam. Electronically Signed   By: Alcide Clever M.D.   On: 05/02/2017 09:52    Scheduled Meds:  Continuous Infusions: . sodium chloride 75 mL/hr at 05/02/17 1046     LOS: 1 day     Briant Cedar, MD [facility]   If 7PM-7AM, please contact night-coverage [email or web address] Password [email or web address] 05/02/2017, 7:20 PM

## 2017-05-02 NOTE — Plan of Care (Signed)
  Education: Knowledge of General Education information will improve 05/02/2017 0306 - Progressing by Olena Materobinson, Lashawn Bromwell G, RN Note POC and orders reviewed with pt. and family; pt. has dementia and unsure how much she understands.

## 2017-05-02 NOTE — Progress Notes (Signed)
CCMD called earlier HR 37 nonsustained; then 2317 pt. had a 2.55 sec. pause, and 2333 pt. had 8 beat run VT and paged M. Lynch,NP and informed; RTC and no new orders.

## 2017-05-03 DIAGNOSIS — W19XXXA Unspecified fall, initial encounter: Secondary | ICD-10-CM

## 2017-05-03 DIAGNOSIS — N183 Chronic kidney disease, stage 3 (moderate): Secondary | ICD-10-CM

## 2017-05-03 DIAGNOSIS — I10 Essential (primary) hypertension: Secondary | ICD-10-CM

## 2017-05-03 DIAGNOSIS — Z515 Encounter for palliative care: Secondary | ICD-10-CM

## 2017-05-03 DIAGNOSIS — T68XXXA Hypothermia, initial encounter: Secondary | ICD-10-CM

## 2017-05-03 DIAGNOSIS — F0391 Unspecified dementia with behavioral disturbance: Secondary | ICD-10-CM

## 2017-05-03 DIAGNOSIS — Z7189 Other specified counseling: Secondary | ICD-10-CM

## 2017-05-03 DIAGNOSIS — K219 Gastro-esophageal reflux disease without esophagitis: Secondary | ICD-10-CM

## 2017-05-03 DIAGNOSIS — G3183 Dementia with Lewy bodies: Secondary | ICD-10-CM

## 2017-05-03 DIAGNOSIS — I482 Chronic atrial fibrillation: Secondary | ICD-10-CM

## 2017-05-03 DIAGNOSIS — F0281 Dementia in other diseases classified elsewhere with behavioral disturbance: Secondary | ICD-10-CM

## 2017-05-03 LAB — CBC WITH DIFFERENTIAL/PLATELET
BASOS ABS: 0 10*3/uL (ref 0.0–0.1)
Basophils Relative: 0 %
EOS PCT: 1 %
Eosinophils Absolute: 0.1 10*3/uL (ref 0.0–0.7)
HCT: 30.4 % — ABNORMAL LOW (ref 36.0–46.0)
Hemoglobin: 9.6 g/dL — ABNORMAL LOW (ref 12.0–15.0)
LYMPHS PCT: 21 %
Lymphs Abs: 1.7 10*3/uL (ref 0.7–4.0)
MCH: 29.4 pg (ref 26.0–34.0)
MCHC: 31.6 g/dL (ref 30.0–36.0)
MCV: 93 fL (ref 78.0–100.0)
MONO ABS: 1 10*3/uL (ref 0.1–1.0)
Monocytes Relative: 12 %
Neutro Abs: 5.5 10*3/uL (ref 1.7–7.7)
Neutrophils Relative %: 66 %
PLATELETS: 220 10*3/uL (ref 150–400)
RBC: 3.27 MIL/uL — ABNORMAL LOW (ref 3.87–5.11)
RDW: 14.1 % (ref 11.5–15.5)
WBC: 8.3 10*3/uL (ref 4.0–10.5)

## 2017-05-03 LAB — BASIC METABOLIC PANEL
ANION GAP: 11 (ref 5–15)
BUN: 51 mg/dL — AB (ref 6–20)
CALCIUM: 9.3 mg/dL (ref 8.9–10.3)
CO2: 20 mmol/L — ABNORMAL LOW (ref 22–32)
CREATININE: 1.95 mg/dL — AB (ref 0.44–1.00)
Chloride: 110 mmol/L (ref 101–111)
GFR calc Af Amer: 26 mL/min — ABNORMAL LOW (ref >60)
GFR, EST NON AFRICAN AMERICAN: 22 mL/min — AB (ref >60)
GLUCOSE: 68 mg/dL (ref 65–99)
Potassium: 4.2 mmol/L (ref 3.5–5.1)
Sodium: 141 mmol/L (ref 135–145)

## 2017-05-03 LAB — GLUCOSE, CAPILLARY
GLUCOSE-CAPILLARY: 82 mg/dL (ref 65–99)
Glucose-Capillary: 65 mg/dL (ref 65–99)

## 2017-05-03 LAB — CK: Total CK: 524 U/L — ABNORMAL HIGH (ref 38–234)

## 2017-05-03 MED ORDER — HALOPERIDOL LACTATE 2 MG/ML PO CONC
2.0000 mg | Freq: Every evening | ORAL | Status: DC | PRN
  Filled 2017-05-03: qty 1

## 2017-05-03 MED ORDER — APIXABAN 2.5 MG PO TABS
2.5000 mg | ORAL_TABLET | Freq: Two times a day (BID) | ORAL | Status: DC
Start: 2017-05-03 — End: 2017-05-03

## 2017-05-03 MED ORDER — HALOPERIDOL LACTATE 2 MG/ML PO CONC: 2.0000 mg | Freq: Every evening | ORAL | 0 refills | Status: AC | PRN

## 2017-05-03 MED ORDER — OXYBUTYNIN CHLORIDE 5 MG PO TABS: 5.0000 mg | ORAL_TABLET | Freq: Two times a day (BID) | ORAL | 0 refills | Status: AC

## 2017-05-03 MED ORDER — OXYBUTYNIN CHLORIDE 5 MG PO TABS
5.0000 mg | ORAL_TABLET | Freq: Two times a day (BID) | ORAL | Status: DC
  Administered 2017-05-03: 5 mg via ORAL
  Filled 2017-05-03: qty 1

## 2017-05-03 MED ORDER — LOSARTAN POTASSIUM 25 MG PO TABS: 25.0000 mg | ORAL_TABLET | Freq: Every day | ORAL | 11 refills | Status: AC

## 2017-05-03 NOTE — Evaluation (Signed)
Occupational Therapy Evaluation and Discharge Patient Details Name: Caitlyn Lynch MRN: [id number] DOB: 07-21-1930 Today's Date: 05/03/2017    History of Present Illness Pt is an 83 y.o. female with PMH ofDementia, Atrial fibrillation, CAD, Chronic renal disease, admitted after a fall/ hitting head on bathtub. Family reports pt has recently had a decrease in her appetite. Pt has had recent increasing confusion. Head CT showed nothing acute; CXR showed mild pulmonary vascular congestion. Palliative consult noted.   Clinical Impression   PTA Pt reports she was independent in ADL and mobility. Pt is currently close to baseline for ADL and functional transfers. Pt required some stabilization from furniture for mobility, but able to perform toilet transfer, peri care, standing grooming, and don socks, shoes, and tie shoe laces without assist. OT to defer further services to Hospice care to maximize safety and independence in ADL and functional transfers. OT to sign off in the acute setting. Thank you for the opportunity to serve this patient.     Follow Up Recommendations  Other (comment)(defer judgement to hospice)    Equipment Recommendations  None recommended by OT(Pt has appropriate DME (shower chair))    Recommendations for Other Services       Precautions / Restrictions Precautions Precautions: Fall Restrictions Weight Bearing Restrictions: No      Mobility Bed Mobility               General bed mobility comments: Pt OOB in recliner when therapy entered  Transfers Overall transfer level: Needs assistance Equipment used: None;1 person hand held assist(varied throughout session) Transfers: Sit to/from Stand Sit to Stand: Min guard;Min assist         General transfer comment: 50% of the time required external assist    Balance Overall balance assessment: Needs assistance Sitting-balance support: No upper extremity supported;Feet supported Sitting balance-Leahy  Scale: Normal     Standing balance support: Single extremity supported;During functional activity Standing balance-Leahy Scale: Fair Standing balance comment: requires support from external source, in both dynamic and static positions                           ADL either performed or assessed with clinical judgement   ADL Overall ADL's : At baseline                                       General ADL Comments: required some stabilization from furniture as she walked to bathroom, but able to perform toilet transfer, peri care, standing grooming, and don socks, shoes, and tie shoe laces without assist     Vision         Perception     Praxis      Pertinent Vitals/Pain Pain Assessment: No/denies pain     Hand Dominance     Extremity/Trunk Assessment Upper Extremity Assessment Upper Extremity Assessment: Overall WFL for tasks assessed   Lower Extremity Assessment Lower Extremity Assessment: Defer to PT evaluation       Communication Communication Communication: No difficulties   Cognition Arousal/Alertness: Awake/alert Behavior During Therapy: WFL for tasks assessed/performed;Flat affect Overall Cognitive Status: No family/caregiver present to determine baseline cognitive functioning                                 General Comments: WFL for session, knew  she was at the hospital, oriented to herself, and knew birthday as well   General Comments       Exercises     Shoulder Instructions      Home Living Family/patient expects to be discharged to:: Private residence(with Hospice) Living Arrangements: Children(son) Available Help at Discharge: Family;Available PRN/intermittently(almost all the time) Type of Home: House Home Access: Stairs to enter Entergy CorporationEntrance Stairs-Number of Steps: 1   Home Layout: One level     Bathroom Shower/Tub: IT trainerTub/shower unit;Curtain   Bathroom Toilet: Standard     Home Equipment: Shower  seat          Prior Functioning/Environment Level of Independence: Independent                 OT Problem List: Impaired balance (sitting and/or standing);Decreased safety awareness      OT Treatment/Interventions:      OT Goals(Current goals can be found in the care plan section) Acute Rehab OT Goals Patient Stated Goal: get home OT Goal Formulation: With patient Time For Goal Achievement: 05/17/17 Potential to Achieve Goals: Good  OT Frequency:     Barriers to D/C:            Co-evaluation PT/OT/SLP Co-Evaluation/Treatment: Yes Reason for Co-Treatment: Complexity of the patient's impairments (multi-system involvement);For patient/therapist safety;To address functional/ADL transfers PT goals addressed during session: Mobility/safety with mobility OT goals addressed during session: ADL's and self-care      AM-PAC PT "6 Clicks" Daily Activity     Outcome Measure Help from another person eating meals?: None Help from another person taking care of personal grooming?: A Little Help from another person toileting, which includes using toliet, bedpan, or urinal?: A Little Help from another person bathing (including washing, rinsing, drying)?: A Little Help from another person to put on and taking off regular upper body clothing?: A Little Help from another person to put on and taking off regular lower body clothing?: None 6 Click Score: 20   End of Session Equipment Utilized During Treatment: Gait belt Nurse Communication: Mobility status;Other (comment)(no urination)  Activity Tolerance: Patient tolerated treatment well Patient left: in chair;with chair alarm set;with call bell/phone within reach;Other (comment)(tele sitter notified Pt return to room)  OT Visit Diagnosis: Unsteadiness on feet (R26.81);History of falling (Z91.81)                Time: [id number] OT Time Calculation (min): 23 min Charges:  OT General Charges $OT Visit: 1 Visit OT Evaluation $OT  Eval Low Complexity: 1 Low G-Codes:     Sherryl MangesLaura Reigna Lynch OTR/L [phone]  Caitlyn BioLaura J Nikitas Lynch 05/03/2017, 4:09 PM

## 2017-05-03 NOTE — Plan of Care (Signed)
Progressing

## 2017-05-03 NOTE — Consult Note (Signed)
Consultation Note Date: 05/03/2017   Patient Name: Caitlyn Lynch  DOB: 12-04-1930  MRN: [id number]  Age / Sex: 83 y.o., female  PCP: Berkley Harvey, NP Referring Physician: Alma Friendly, MD  Reason for Consultation: Establishing goals of care  HPI/Patient Profile: 83 y.o. female  with past medical history of CAD, valvular disease, CHF (per family), CKD 3, advanced lewey body dementia, subclavian steel syndrome, who was admitted on 05/01/2017 after a fall hitting her head. She initial work up revealed hypothermia (91.5), hypotension, and bradycardia (48).  No source of infection was found and no acute changes were found on her head CT.  Medications have been adjusted.  The patient appears to be stable and back to baseline.  Clinical Assessment and Goals of Care:  I have reviewed medical records including EPIC notes, labs and imaging, received report from the care team, assessed the patient and then met at the bedside along with her son (MATT) daughter and daughter in law  to discuss diagnosis prognosis, GOC, EOL wishes, disposition and options.  I introduced Palliative Medicine as specialized medical care for people living with serious illness. It focuses on providing relief from the symptoms and stress of a serious illness. The goal is to improve quality of life for both the patient and the family.  We discussed a brief life review of the patient. She was a very pleasant sweet woman raising her children.  She developed Lewy Body Dementia and lives at home with her son Catalina Antigua, his wife, and their 7 children.  Per Matt her health has declined very rapidly in recent months.   Her eating has become very erratic.  At time she will not eat and at other times she will sit and eat peanut butter out of the jar.  She has recently developed gait instability.  At times she does not recognize the family.  She ambulates but  becomes SOB just going from one room to the next.  For 18 hours a day she paces to / from the bathroom.  Catalina Antigua states that she uses a [employer] sized pack of toilet paper (72 rolls?!) in 1.5 days.   Matt and his sister were very saddened.  They believe Mrs. Salois would not want to live this way.  She would not want to live with advanced dementia.  The difference between aggressive medical intervention and comfort care was considered in light of the patient's goals of care. A MOST form was completed.  The family elected:  DNR  Comfort Measures Only, Do not return to the hospital unless she can not be kept comfortable.  No antibiotics  No IV fluids  No artificial feeding.   Hospice and Palliative Care services outpatient were explained and offered.  Questions and concerns were addressed.  Hard Choices booklet left for review. The family was encouraged to call with questions or concerns.    Primary Decision Maker:  NEXT OF KIN son Matt    SUMMARY OF RECOMMENDATIONS    D/C to home with  home hospice care. Haldol 2 mg SL qhs PRN agitation Ditropan 5 mg bid to help with urinary retention.  Minimize other medications - recommend eliminating anticoagulation due to falling. MOST form completed.    DNR  Comfort Measures Only, Do not return to the hospital unless she can not be kept comfortable.  No antibiotics  No IV fluids  No artificial feeding.  Code Status/Advance Care Planning:  DNR  Additional Recommendations (Limitations, Scope, Preferences):  Full Comfort Care  Prognosis:  Less than 6 months given advanced CAD, Afib, Advanced Lewy Body Dementia, poor PO intake and rapid decline.  Discharge Planning: Home with Hospice      Primary Diagnoses: Present on Admission: . Altered mental state   I have reviewed the medical record, interviewed the patient and family, and examined the patient. The following aspects are pertinent.  Past Medical History:  Diagnosis Date    . Arthropathy, unspecified, site unspecified   . Atrial fibrillation (HCC)   . Body mass index 28.0-28.9, adult   . CKD (chronic kidney disease)   . Contact with or exposure to other viral diseases(V01.79)   . Coronary artery disease   . Dementia   . Encounter for long-term (current) use of other medications   . Essential hypertension, benign    rate on EKG 56-60  . Gout, unspecified   . Memory loss    27/30 on MMSE  . Migraine, unspecified, without mention of intractable migraine without mention of status migrainosus    CT head, Negative 2014  . Other and unspecified hyperlipidemia   . Other cells and casts in urine   . Other ear problems   . Other specified noninflammatory disorder of vagina   . Pain in joint, shoulder region   . Personal history of other disorders of nervous system and sense organs    Seen by Dr. Digby   . Personal history of urinary (tract) infection   . Subclavian steal syndrome    noted on Carotid Doppler 08/2012   Social History   Socioeconomic History  . Marital status: Widowed    Spouse name: Not on file  . Number of children: 4  . Years of education: 12  . Highest education level: Not on file  Social Needs  . Financial resource strain: Not on file  . Food insecurity - worry: Not on file  . Food insecurity - inability: Not on file  . Transportation needs - medical: Not on file  . Transportation needs - non-medical: Not on file  Occupational History  . Not on file  Tobacco Use  . Smoking status: Never Smoker  . Smokeless tobacco: Never Used  Substance and Sexual Activity  . Alcohol use: No  . Drug use: No  . Sexual activity: Not on file  Other Topics Concern  . Not on file  Social History Narrative   Lives: with son, Matt   Single, 4 children   Right handed   12 th grade   No caffeine   Family History  Problem Relation Age of Onset  . Cancer Father        Bladder  . Diabetes Son   . Alzheimer's disease Sister    Scheduled  Meds: . apixaban  2.5 mg Oral BID  . pantoprazole  20 mg Oral Daily   Continuous Infusions: . sodium chloride 75 mL/hr at 05/02/17 2210   PRN Meds:.LORazepam, morphine injection, white petrolatum Allergies  Allergen Reactions  . Memantine Other (See Comments)      hallucinations   Review of Systems patient is unable to provide  Physical Exam  Well developed elderly female, calm sitting in recliner chair. Demented. CV irreg irreg Resp no distress Abdomen soft, NT  Vital Signs: BP (!) 157/39 (BP Location: Left Arm)   Pulse 76   Temp (!) 97.5 F (36.4 C) (Oral)   Resp 18   SpO2 98%  Pain Assessment: No/denies pain   Pain Score: 0-No pain   SpO2: SpO2: 98 % O2 Device:SpO2: 98 % O2 Flow Rate: .   IO: Intake/output summary:   Intake/Output Summary (Last 24 hours) at 05/03/2017 1306 Last data filed at 05/03/2017 1230 Gross per 24 hour  Intake 3400.92 ml  Output 2 ml  Net 3398.92 ml    LBM: Last BM Date: 05/02/17 Baseline Weight:   Most recent weight:       Palliative Assessment/Data: 30%     Time In: 12:00 pm Time Out: 1:30 pm Time Total: 90 min Greater than 50%  of this time was spent counseling and coordinating care related to the above assessment and plan.  Signed by: Florentina Jenny, PA-C Palliative Medicine Pager: [phone]  Please contact Palliative Medicine Team phone at [phone] for questions and concerns.  For individual provider: See Shea Evans

## 2017-05-03 NOTE — Progress Notes (Signed)
Bladder scan revealed 318 provider notified

## 2017-05-03 NOTE — Progress Notes (Signed)
OT Cancellation Note  Patient Details Name: Caitlyn Lynch MRN: [id number] DOB: 04/22/1931   Cancelled Treatment:    Reason Eval/Treat Not Completed: Patient at procedure or test/ unavailable. Pt at ultrasound. Will check back later as schedule allows for OT evaluation.  Evern BioLaura J Nathaneil Feagans 05/03/2017, 8:48 AM  Sherryl MangesLaura Shemuel Harkleroad OTR/L [phone]

## 2017-05-03 NOTE — Care Management Note (Addendum)
Case Management Note Donn PieriniKristi Sameena Artus RN, BSN Unit 4E-Case Manager--5W weekend coverage [phone]   Patient Details  Name: Caitlyn RollingRuth V Bohannon MRN: [id number] Date of Birth: [birth year]  Subjective/Objective:  Pt admitted with acute metabolic encephalopathy                   Action/Plan: PTA pt lived at home with son- referral received for home hospice- noted interest in [facility]. -  Referral called to [facility]- pt to be discharged home today with family.  Update-1530- received call back from Nelson Chimesiana Stevens with [facility]- referral has been accepted- they will admit pt in the am to Hospice services- which pt's son is agreeable to- pt to go to son's home- address- [address] phone: [phone] son to transport via car.    Expected Discharge Date:  05/03/17               Expected Discharge Plan:  Home w Hospice Care  In-House Referral:  NA  Discharge planning Services  CM Consult  Post Acute Care Choice:  Hospice Choice offered to:  Patient, Adult Children  DME Arranged:    DME Agency:     HH Arranged:  Disease Management HH Agency:  [facility]  Status of Service:  Completed, signed off  If discussed at Long Length of Stay Meetings, dates discussed:     Discharge Disposition: home with Home Hospice    Additional Comments:  Darrold SpanWebster, Manuel Dall Hall, RN 05/03/2017, 2:47 PM

## 2017-05-03 NOTE — Evaluation (Signed)
Physical Therapy Evaluation Patient Details Name: Caitlyn Lynch MRN: [id number] DOB: Jul 13, 1930 Today's Date: 05/03/2017   History of Present Illness  Pt is an 83 y.o. female with PMH ofDementia, Atrial fibrillation, CAD, Chronic renal disease, admitted after a fall/ hitting head on bathtub. Family reports pt has recently had a decrease in her appetite. She has been giving her food to the dog. Pt has had recent increasing confusion. Pt has not had any recent illness. No cough, no uti symptoms, no diarrhea or vomiting. Family reports they do not want aggressive treatment. Head CT showed nothing acute; CXR showed mild pulmonary vascular congestion. Palliative consult noted  Clinical Impression   Pt admitted with above diagnosis. Pt currently with functional limitations due to the deficits listed below (see PT Problem List). Independent household ambulator prior to this admission; Noted plan for dc home with hospice services, and in agreement; It may be worth considering an HHPT home safety eval to help discern if/when it may be appropriate for an assistive device to walk, but will defer to Hospice services;  Pt will benefit from skilled PT to increase their independence and safety with mobility to allow discharge to the venue listed below.       Follow Up Recommendations Supervision/Assistance - 24 hour;Other (comment);Home health PT(Hospice Services)    Equipment Recommendations  Other (comment)(Worth considering HHPT to help discern if/when an assistive device will be helpful in familiar, home environment)    Recommendations for Other Services       Precautions / Restrictions Precautions Precautions: Fall Restrictions Weight Bearing Restrictions: No      Mobility  Bed Mobility               General bed mobility comments: Pt OOB in recliner when therapy entered  Transfers Overall transfer level: Needs assistance Equipment used: None;1 person hand held assist(varied  throughout session) Transfers: Sit to/from Stand Sit to Stand: Min guard;Min assist         General transfer comment: 50% of the time required external assist  Ambulation/Gait Ambulation/Gait assistance: Min assist Ambulation Distance (Feet): 200 Feet(approximately) Assistive device: None Gait Pattern/deviations: Step-through pattern;Decreased step length - right;Decreased step length - left;Decreased stride length     General Gait Details: TEnding to reach out to furniture/hallway rail for stability while walking; a few instances of loss of balance, with min assist to steady  Stairs            Wheelchair Mobility    Modified Rankin (Stroke Patients Only)       Balance Overall balance assessment: Needs assistance Sitting-balance support: No upper extremity supported;Feet supported Sitting balance-Leahy Scale: Normal     Standing balance support: Single extremity supported;During functional activity Standing balance-Leahy Scale: Fair Standing balance comment: requires support from external source, in both dynamic and static positions                             Pertinent Vitals/Pain Pain Assessment: No/denies pain    Home Living Family/patient expects to be discharged to:: Private residence(with Hospice) Living Arrangements: Children(son) Available Help at Discharge: Family;Available PRN/intermittently(almost all the time) Type of Home: House Home Access: Stairs to enter   Enter[employer] CorporationEntrance Stairs-Number of Steps: 1 Home Layout: One level Home Equipment: Shower seat      Prior Function Level of Independence: Independent               Hand Dominance  Extremity/Trunk Assessment   Upper Extremity Assessment Upper Extremity Assessment: Defer to OT evaluation    Lower Extremity Assessment Lower Extremity Assessment: Generalized weakness       Communication   Communication: No difficulties  Cognition Arousal/Alertness:  Awake/alert Behavior During Therapy: WFL for tasks assessed/performed;Flat affect Overall Cognitive Status: No family/caregiver present to determine baseline cognitive functioning                                 General Comments: WFL for session, knew she was at the hospital, oriented to herself, and knew birthday as well      General Comments      Exercises     Assessment/Plan    PT Assessment Patient needs continued PT services  PT Problem List Decreased strength;Decreased activity tolerance;Decreased balance;Decreased mobility;Decreased cognition;Decreased knowledge of use of DME;Decreased safety awareness;Decreased knowledge of precautions       PT Treatment Interventions DME instruction;Gait training;Stair training;Functional mobility training;Therapeutic activities;Therapeutic exercise;Neuromuscular re-education;Balance training;Patient/family education    PT Goals (Current goals can be found in the Care Plan section)  Acute Rehab PT Goals Patient Stated Goal: get home PT Goal Formulation: Patient unable to participate in goal setting Time For Goal Achievement: 05/17/17 Potential to Achieve Goals: Fair    Frequency Min 3X/week   Barriers to discharge        Co-evaluation PT/OT/SLP Co-Evaluation/Treatment: Yes Reason for Co-Treatment: Complexity of the patient's impairments (multi-system involvement) PT goals addressed during session: Mobility/safety with mobility OT goals addressed during session: ADL's and self-care       AM-PAC PT "6 Clicks" Daily Activity  Outcome Measure Difficulty turning over in bed (including adjusting bedclothes, sheets and blankets)?: A Little Difficulty moving from lying on back to sitting on the side of the bed? : A Little Difficulty sitting down on and standing up from a chair with arms (e.g., wheelchair, bedside commode, etc,.)?: A Little Help needed moving to and from a bed to chair (including a wheelchair)?: A  Little Help needed walking in hospital room?: A Little Help needed climbing 3-5 steps with a railing? : A Lot 6 Click Score: 17    End of Session Equipment Utilized During Treatment: Gait belt Activity Tolerance: Patient tolerated treatment well Patient left: in chair;with call bell/phone within reach(tele-sitter in room) Nurse Communication: Mobility status PT Visit Diagnosis: Unsteadiness on feet (R26.81)    Time: 7829-56211409-1427 PT Time Calculation (min) (ACUTE ONLY): 18 min   Charges:   PT Evaluation $PT Eval Low Complexity: 1 Low     PT G Codes:        Caitlyn Lynch, PT  Acute Rehabilitation Services Pager [phone] Office [phone]   Caitlyn Lynch 05/03/2017, 4:51 PM

## 2017-05-03 NOTE — Evaluation (Signed)
Clinical/Bedside Swallow Evaluation Patient Details  Name: Caitlyn Lynch MRN: [id number] Date of Birth: 24-Jan-1931  Today's Date: 05/03/2017 Time: SLP Start Time (ACUTE ONLY): [id number] SLP Stop Time (ACUTE ONLY): 0943 SLP Time Calculation (min) (ACUTE ONLY): 15 min  Past Medical History:  Past Medical History:  Diagnosis Date  . Arthropathy, unspecified, site unspecified   . Atrial fibrillation (HCC)   . Body mass index 28.0-28.9, adult   . CKD (chronic kidney disease)   . Contact with or exposure to other viral diseases(V01.79)   . Coronary artery disease   . Dementia   . Encounter for long-term (current) use of other medications   . Essential hypertension, benign    rate on EKG 56-60  . Gout, unspecified   . Memory loss    27/30 on MMSE  . Migraine, unspecified, without mention of intractable migraine without mention of status migrainosus    CT head, Negative 2014  . Other and unspecified hyperlipidemia   . Other cells and casts in urine   . Other ear problems   . Other specified noninflammatory disorder of vagina   . Pain in joint, shoulder region   . Personal history of other disorders of nervous system and sense organs    Seen by Dr. Hazle Quantigby   . Personal history of urinary (tract) infection   . Subclavian steal syndrome    noted on Carotid Doppler 08/2012   Past Surgical History:  Past Surgical History:  Procedure Laterality Date  . EXTERNAL EAR SURGERY    . TONSILLECTOMY  1945   HPI:  Pt is an 83 y.o. female with PMH ofDementia, Atrial fibrillation, CAD, Chronic renal disease, admitted after a fall/ hitting head on bathtub. Family reports pt has recently had a decrease in her appetite. She has been giving her food to the dog. Pt has had recent increasing confusion. Pt has not had any recent illness. No cough, no uti symptoms, no diarrhea or vomiting. Family reports they do not want aggressive treatment. Head CT showed nothing acute; CXR showed mild pulmonary vascular  congestion. Palliative consult noted. Bedside swallow eval ordered; pt currently on full liquid diet.   Assessment / Plan / Recommendation Clinical Impression  Pt showed no overt s/s of aspiration during evaluation and no overt difficulty consuming the PO trials offered. Pt was alert, responsive, able to follow simple commands and answer simple questions during this evaluation and was able to feed self. Pt is still at an increased risk of aspiration given advanced dementia. Recommend advancing diet to regular/ thin liquids, meds whole with liquid, intermittent supervision to ensure pt seated upright, taking small bites/ sips, minimize distractions. SLP will sign off at this time; please re-consult if needs arise.  SLP Visit Diagnosis: Dysphagia, unspecified (R13.10)    Aspiration Risk  Mild aspiration risk    Diet Recommendation Regular;Thin liquid   Liquid Administration via: Cup;Straw Medication Administration: Whole meds with liquid Supervision: Patient able to self feed;Intermittent supervision to cue for compensatory strategies Compensations: Minimize environmental distractions;Slow rate;Small sips/bites Postural Changes: Seated upright at 90 degrees    Other  Recommendations Oral Care Recommendations: Oral care BID   Follow up Recommendations 24 hour supervision/assistance      Frequency and Duration            Prognosis        Swallow Study   General HPI: Pt is an 83 y.o. female with PMH ofDementia, Atrial fibrillation, CAD, Chronic renal disease, admitted after a fall/ hitting  head on bathtub. Family reports pt has recently had a decrease in her appetite. She has been giving her food to the dog. Pt has had recent increasing confusion. Pt has not had any recent illness. No cough, no uti symptoms, no diarrhea or vomiting. Family reports they do not want aggressive treatment. Head CT showed nothing acute; CXR showed mild pulmonary vascular congestion. Palliative consult  noted. Bedside swallow eval ordered; pt currently on full liquid diet. Type of Study: Bedside Swallow Evaluation Previous Swallow Assessment: none in chart Diet Prior to this Study: Other (Comment)(full liquid) Temperature Spikes Noted: No Respiratory Status: Room air History of Recent Intubation: No Behavior/Cognition: Alert;Cooperative Oral Cavity Assessment: Within Functional Limits Oral Cavity - Dentition: Missing dentition Vision: Functional for self-feeding Self-Feeding Abilities: Able to feed self Patient Positioning: Upright in chair Baseline Vocal Quality: Normal Volitional Cough: Strong    Oral/Motor/Sensory Function Overall Oral Motor/Sensory Function: Within functional limits   Ice Chips Ice chips: Not tested   Thin Liquid Thin Liquid: Within functional limits Presentation: Straw    Nectar Thick Nectar Thick Liquid: Not tested   Honey Thick Honey Thick Liquid: Not tested   Puree Puree: Within functional limits Presentation: Self Fed;Spoon   Solid   GO   Solid: Within functional limits Presentation: Self Fed        Amy Cecille AverK Oleksiak, MA, CCC-SLP 05/03/2017,9:45 AM [phone]

## 2017-05-03 NOTE — Progress Notes (Signed)
Nsg Discharge Note  Admit Date:  05/01/2017 Discharge date: 05/03/2017   Leroy Kennedyuth V Fillingim to be D/C'd Home per MD order.  AVS completed.  Copy for chart, and copy for patient signed, and dated. Patient/caregiver able to verbalize understanding.  Discharge Medication: Allergies as of 05/03/2017      Reactions   Memantine Other (See Comments)   hallucinations      Medication List    STOP taking these medications   atenolol 100 MG tablet Commonly known as:  TENORMIN   donepezil 10 MG tablet Commonly known as:  ARICEPT   ELIQUIS 5 MG Tabs tablet Generic drug:  apixaban   losartan-hydrochlorothiazide 100-12.5 MG tablet Commonly known as:  HYZAAR   PLAVIX PO   ZOCOR PO     TAKE these medications   allopurinol 100 MG tablet Commonly known as:  ZYLOPRIM Take 100 mg by mouth daily.   AMLODIPINE BESYLATE PO Take 5 mg by mouth.   haloperidol 2 MG/ML solution Commonly known as:  HALDOL Take 1 mL (2 mg total) by mouth at bedtime as needed for agitation.   losartan 25 MG tablet Commonly known as:  COZAAR Take 1 tablet (25 mg total) by mouth daily.   nitroGLYCERIN 0.4 MG SL tablet Commonly known as:  NITROSTAT Place 0.4 mg under the tongue every 5 (five) minutes as needed for chest pain.   oxybutynin 5 MG tablet Commonly known as:  DITROPAN Take 1 tablet (5 mg total) by mouth 2 (two) times daily.   pantoprazole 20 MG tablet Commonly known as:  PROTONIX   pramipexole 0.25 MG tablet Commonly known as:  MIRAPEX   traMADol 50 MG tablet Commonly known as:  ULTRAM Take 50 mg by mouth every 6 (six) hours as needed.       Discharge Assessment: Vitals:   05/03/17 0615 05/03/17 1434  BP: (!) 157/39 (!) 123/54  Pulse: 76 67  Resp: 18 16  Temp: (!) 97.5 F (36.4 C) 97.6 F (36.4 C)  SpO2: 98% 100%   Skin clean, dry and intact without evidence of skin break down, no evidence of skin tears noted. IV catheter discontinued intact. Site without signs and symptoms of  complications - no redness or edema noted at insertion site, patient denies c/o pain - only slight tenderness at site.  Dressing with slight pressure applied.  D/c Instructions-Education: Discharge instructions given to patient/family with verbalized understanding. D/c education completed with patient/family including follow up instructions, medication list, d/c activities limitations if indicated, with other d/c instructions as indicated by MD - patient able to verbalize understanding, all questions fully answered. Patient instructed to return to ED, call 911, or call MD for any changes in condition.  Patient escorted via WC, and D/C home via private auto.  Camillo FlamingVicki L Rania Prothero, RN 05/03/2017 4:59 PM

## 2017-05-03 NOTE — Progress Notes (Signed)
Assisted patient to bathroom, wiped her down with cold washcloths to aid voiding, no void at this time, notifying provider.

## 2017-05-03 NOTE — Discharge Summary (Signed)
Discharge Summary  Caitlyn Lynch QKM:[id number] DOB: 1930-07-15  PCP: Berkley Harvey, NP  Admit date: 05/01/2017 Discharge date: 05/03/2017  Time spent: >46mns   Recommendations for Outpatient Follow-up:  1. Home hospice  Discharge Diagnoses:  Active Hospital Problems   Diagnosis Date Noted  . Fall   . Hypothermia   . Palliative care encounter   . Encounter for hospice care discussion   . Altered mental state 05/01/2017    Resolved Hospital Problems  No resolved problems to display.    Discharge Condition: Fair  Diet recommendation: Heart healthy  Vitals:   05/03/17 0615 05/03/17 1434  BP: (!) 157/39 (!) 123/54  Pulse: 76 67  Resp: 18 16  Temp: (!) 97.5 F (36.4 C) 97.6 F (36.4 C)  SpO2: 98% 100%    History of present illness:  Caitlyn Lynch a 83y.o.femalewith medical history significant of advanced dementia, Atrial fibrillation, CAD, Chronic renal disease. Pt lives with her family. Pt's family reports pt paces between bathroom and her bedroom. Pt's Daughter in law noticed pt went to the bathroom but did not come out for about 15 minutes. When she went to check on pt she had fallen and hit her head on the bathtub. Son reports pt was breathing heavy. Family reports pt has recently had a decrease in her appetite.  She has been giving her food to the dog. Pt has had recent increasing confusion. Pt has not had any recent illness. No cough, no uti symptoms, no diarrhea or vomiting. Family reports they do not want any heroic measures. In the ED, pt was noted to be hypothermic, temp around 91, bradycardic and hypotensive. CT head done was negative. Bear hugger was placed. [facility] Pt admitted for further management.  Today, pt noted to be alert, awake, not oriented. VSS stable. Pt unable to answer questions correctly. Looked comfortable, not in distress. Denied any new symptoms. Palliative met with patients son/family who agreed they  wanted patient to be hospice. Pt is stable to be discharged to home hospice. Please refer to palliative team note for more details   Hospital Course:  Active Problems:   Altered mental state   Fall   Hypothermia   Palliative care encounter   Encounter for hospice care discussion  #Acute metabolic encephalopathy Stable Unclear etiology, likely medication induced Vs worsening dementia Unlikely sepsis/infection Vs CVA Vs Uremia Hypothermic/hypotensive on admission, no signs of infection No leukocytosis  BC X2 NGTD UA neg, UC no growth CXR: Enlarged cardiac silhouette with mild pulmonary vascular congestion. CT head X 2, no acute abnormality CT Abd/pelvis: No acute abnormality Palliative team on board, home hospice  #Hypothermia: Improved Work up as above, no signs of sepsis Keep warm at home  #Hypotensive/Bradycardia Resolved Work as above, no signs of sepsis Likely medication induced in the setting of poor oral intake: Pt on losartan-HCT 100-12.5, amlodipine 515m atenolol 10077mlasix 20 mg Discontinued losartan/HCT 100-12.5, atenolol 100 mg May continue amlodipine 5mg67mosartan 25 mg, hospice team may d/c them if BP is soft  #AKI on CKD stage III Unknown baseline Avoid nephrotoxics  #Chronic Afib Rate controlled EKG showed sinus brady D/C atenolol, eliquis as pt is hospice with hx of falls  #HTN: Discontinued losartan/HCT 100-12.5, atenolol 100 mg May continue amlodipine 5mg,51msartan 25 mg, hospice team may d/c them if BP is soft   #CAD Chest pain free D/C plavix as pt has a hx of falls  #  Advanced Dementia Worsening Discontinue Aricept as it can cause bradycardia Home hospice  #Restless leg Home pramipexole  #HLD Discontinue zocor 58m daily  #GERD Continue pantoprazole  #Urinary frequency ?? Urge incontinence, no signs of UTI, UA neg, UC NGTD Started on oxybutynin    Procedures:  None  Consultations:  Palliative team    Discharge Exam: BP (!) 123/54 (BP Location: Left Arm)   Pulse 67   Temp 97.6 F (36.4 C) (Oral)   Resp 16   SpO2 100%   General: Alert, awake, not oriented  Cardiovascular: S1-S2 present, no added hrt sound Respiratory: Chest clear bilaterally   Discharge Instructions You were cared for by a hospitalist during your hospital stay. If you have any questions about your discharge medications or the care you received while you were in the hospital after you are discharged, you can call the unit and asked to speak with the hospitalist on call if the hospitalist that took care of you is not available. Once you are discharged, your primary care physician will handle any further medical issues. Please note that NO REFILLS for any discharge medications will be authorized once you are discharged, as it is imperative that you return to your primary care physician (or establish a relationship with a primary care physician if you do not have one) for your aftercare needs so that they can reassess your need for medications and monitor your lab values.  Discharge Instructions    Diet - low sodium heart healthy   Complete by:  As directed    Increase activity slowly   Complete by:  As directed      Allergies as of 05/03/2017      Reactions   Memantine Other (See Comments)   hallucinations      Medication List    STOP taking these medications   atenolol 100 MG tablet Commonly known as:  TENORMIN   donepezil 10 MG tablet Commonly known as:  ARICEPT   ELIQUIS 5 MG Tabs tablet Generic drug:  apixaban   losartan-hydrochlorothiazide 100-12.5 MG tablet Commonly known as:  HYZAAR   PLAVIX PO   ZOCOR PO     TAKE these medications   allopurinol 100 MG tablet Commonly known as:  ZYLOPRIM Take 100 mg by mouth daily.   AMLODIPINE BESYLATE PO Take 5 mg by mouth.   haloperidol 2 MG/ML solution Commonly known as:  HALDOL Take 1 mL (2 mg total) by mouth at bedtime as needed for  agitation.   losartan 25 MG tablet Commonly known as:  COZAAR Take 1 tablet (25 mg total) by mouth daily.   nitroGLYCERIN 0.4 MG SL tablet Commonly known as:  NITROSTAT Place 0.4 mg under the tongue every 5 (five) minutes as needed for chest pain.   oxybutynin 5 MG tablet Commonly known as:  DITROPAN Take 1 tablet (5 mg total) by mouth 2 (two) times daily.   pantoprazole 20 MG tablet Commonly known as:  PROTONIX   pramipexole 0.25 MG tablet Commonly known as:  MIRAPEX   traMADol 50 MG tablet Commonly known as:  ULTRAM Take 50 mg by mouth every 6 (six) hours as needed.      Allergies  Allergen Reactions  . Memantine Other (See Comments)    hallucinations   Follow-up Information    JBerkley Harvey NP. Schedule an appointment as soon as possible for a visit in 1 week(s).   Specialty:  Nurse Practitioner Contact information: [id number]  [place] Follow up.   Why:  Home Hospice referral made for home hospice Contact information: [address] [place] [address] [phone]            The results of significant diagnostics from this hospitalization (including imaging, microbiology, ancillary and laboratory) are listed below for reference.    Significant Diagnostic Studies: Ct Abdomen Pelvis Wo Contrast  Result Date: 05/01/2017 CLINICAL DATA:  Abdominal pain and fever EXAM: CT ABDOMEN AND PELVIS WITHOUT CONTRAST TECHNIQUE: Multidetector CT imaging of the abdomen and pelvis was performed following the standard protocol without IV contrast. COMPARISON:  CT abdomen pelvis 01/29/2015 FINDINGS: Lower chest: There is cardiomegaly. Small right pleural effusion. Bilateral pulmonary septal thickening consistent with pulmonary edema. Coronary artery calcification. Hepatobiliary: Multiple calcified granulomata throughout the liver. Large stone in the dependent portion of the gallbladder. No biliary  dilatation. No focal liver lesion. Pancreas: Normal contours without ductal dilatation. No peripancreatic fluid collection. Spleen: Numerous splenic granulomata. Adrenals/Urinary Tract: --Adrenal glands: Normal. --Right kidney/ureter: No hydronephrosis or perinephric stranding. No nephrolithiasis. No obstructing ureteral stones. Moderate renal atrophy. --Left kidney/ureter: No hydronephrosis or perinephric stranding. No nephrolithiasis. No obstructing ureteral stones. Moderate renal atrophy. --Urinary bladder: Unremarkable. Stomach/Bowel: --Stomach/Duodenum: No hiatal hernia or other gastric abnormality. Normal duodenal course and caliber. --Small bowel: No dilatation or inflammation. --Colon: Rectosigmoid diverticulosis without acute inflammation. --Appendix: Normal. Vascular/Lymphatic: Atherosclerotic calcification is present within the non-aneurysmal abdominal aorta, without hemodynamically significant stenosis. No abdominal or pelvic lymphadenopathy. Reproductive: The appearance of the uterus is normal. There is a cystic lesion in the anterior left adnexa measuring 6.7 x 5.0 cm, decreased in size from the prior study. This has been present on studies as remote is 09/22/2013. Musculoskeletal. Multilevel degenerative disc disease and facet arthrosis. No bony spinal canal stenosis. Other: None. IMPRESSION: 1. No acute abdominopelvic abnormality. 2. Unchanged appearance of large calcified gallstone without evidence of acute cholecystitis. 3. Decreased size of left adnexal cystic lesion, present as far back as 09/22/2013. 4. Cardiomegaly with small right pleural effusion and bibasilar pulmonary edema. 5. Coronary artery and Aortic Atherosclerosis (ICD10-I70.0). Electronically Signed   By: Ulyses Jarred M.D.   On: 05/01/2017 22:14   Dg Chest 2 View  Result Date: 05/01/2017 CLINICAL DATA:  Status post suspected fall. EXAM: CHEST  2 VIEW COMPARISON:  05/29/2016 FINDINGS: Mildly enlarged cardiac silhouette. Calcific  atherosclerotic disease and tortuosity of the aorta. Mediastinal contours appear intact. There is no evidence of pneumothorax or focal consolidation. Increased interstitial markings. Osseous structures are without acute abnormality. Soft tissues are grossly normal. IMPRESSION: Enlarged cardiac silhouette with mild pulmonary vascular congestion. Electronically Signed   By: Fidela Salisbury M.D.   On: 05/01/2017 14:01   Ct Head Wo Contrast  Result Date: 05/02/2017 CLINICAL DATA:  Altered mental status EXAM: CT HEAD WITHOUT CONTRAST TECHNIQUE: Contiguous axial images were obtained from the base of the skull through the vertex without intravenous contrast. COMPARISON:  05/01/2017 FINDINGS: Brain: No evidence of acute infarction, hemorrhage, hydrocephalus, extra-axial collection or mass lesion/mass effect. Mild atrophic and ischemic changes are noted. Vascular: No hyperdense vessel or unexpected calcification. Skull: Normal. Negative for fracture or focal lesion. Sinuses/Orbits: No acute finding. Other: Previously seen scalp hematoma is less well visualized. IMPRESSION: No acute intracranial abnormality noted. Chronic changes similar to that seen on the prior exam. Electronically Signed   By: Inez Catalina M.D.   On: 05/02/2017 09:52   Ct Head Wo Contrast  Result Date: 05/01/2017 CLINICAL DATA:  Unwitnessed fall. Found in bathtub. Left posterior head injury. EXAM: CT HEAD WITHOUT CONTRAST CT CERVICAL SPINE WITHOUT CONTRAST TECHNIQUE: Multidetector CT imaging of the head and cervical spine was performed following the standard protocol without intravenous contrast. Multiplanar CT image reconstructions of the cervical spine were also generated. COMPARISON:  10/31/2015 brain MRI. FINDINGS: CT HEAD FINDINGS Brain: No evidence of parenchymal hemorrhage or extra-axial fluid collection. No mass lesion, mass effect, or midline shift. No CT evidence of acute infarction. Nonspecific mild to moderate subcortical and  periventricular white matter hypodensity, most in keeping with chronic small vessel ischemic change. Cerebral volume is age appropriate. No ventriculomegaly. Vascular: No acute abnormality. Skull: No evidence of calvarial fracture. Sinuses/Orbits: Tiny fluid level in the left sphenoid sinus. Otherwise clear visualized paranasal sinuses. Other: Small to moderate left posterior scalp hematoma. Postsurgical changes are present in the right mastoid. The left mastoid air cells are unopacified. CT CERVICAL SPINE FINDINGS Alignment: Straightening of the cervical spine. No subluxation. Dens is well positioned between the lateral masses of C1. Skull base and vertebrae: No acute fracture. No primary bone lesion or focal pathologic process. Soft tissues and spinal canal: No prevertebral fluid or swelling. No visible canal hematoma. Disc levels: Moderate multilevel degenerative disc disease in the mid to lower cervical spine, most prominent at C5-6. Advanced bilateral facet arthropathy, asymmetrically prominent on the left. No significant degenerative foraminal stenosis. Upper chest: Apical right upper lobe 5 mm solid pulmonary nodule (series 9/ image 68). Other: Clear left mastoid air cells. Postsurgical changes in the right mastoid. No discrete thyroid nodules. No pathologically enlarged cervical nodes. IMPRESSION: 1. Small to moderate left posterior scalp hematoma. 2. No evidence of acute intracranial abnormality. No evidence of calvarial fracture. 3. No cervical spine fracture or subluxation. 4. Mild-to-moderate chronic small vessel ischemic change in the cerebral white matter. 5. Tiny fluid level in the left sphenoid sinus, cannot exclude mild acute sinusitis. 6. Moderate degenerative changes in the cervical spine as detailed. 7. Apical right upper lobe 5 mm solid pulmonary nodule. No follow-up needed if patient is low-risk. Non-contrast chest CT can be considered in 12 months if patient is high-risk. This recommendation  follows the consensus statement: Guidelines for Management of Incidental Pulmonary Nodules Detected on CT Images: From the Fleischner Society 2017; Radiology 2017; 284:228-243. Electronically Signed   By: Ilona Sorrel M.D.   On: 05/01/2017 13:53   Ct Cervical Spine Wo Contrast  Result Date: 05/01/2017 CLINICAL DATA:  Unwitnessed fall. Found in bathtub. Left posterior head injury. EXAM: CT HEAD WITHOUT CONTRAST CT CERVICAL SPINE WITHOUT CONTRAST TECHNIQUE: Multidetector CT imaging of the head and cervical spine was performed following the standard protocol without intravenous contrast. Multiplanar CT image reconstructions of the cervical spine were also generated. COMPARISON:  10/31/2015 brain MRI. FINDINGS: CT HEAD FINDINGS Brain: No evidence of parenchymal hemorrhage or extra-axial fluid collection. No mass lesion, mass effect, or midline shift. No CT evidence of acute infarction. Nonspecific mild to moderate subcortical and periventricular white matter hypodensity, most in keeping with chronic small vessel ischemic change. Cerebral volume is age appropriate. No ventriculomegaly. Vascular: No acute abnormality. Skull: No evidence of calvarial fracture. Sinuses/Orbits: Tiny fluid level in the left sphenoid sinus. Otherwise clear visualized paranasal sinuses. Other: Small to moderate left posterior scalp hematoma. Postsurgical changes are present in the right mastoid. The left mastoid air cells are unopacified. CT CERVICAL SPINE FINDINGS Alignment: Straightening of the cervical spine. No subluxation. Dens is well positioned  between the lateral masses of C1. Skull base and vertebrae: No acute fracture. No primary bone lesion or focal pathologic process. Soft tissues and spinal canal: No prevertebral fluid or swelling. No visible canal hematoma. Disc levels: Moderate multilevel degenerative disc disease in the mid to lower cervical spine, most prominent at C5-6. Advanced bilateral facet arthropathy, asymmetrically  prominent on the left. No significant degenerative foraminal stenosis. Upper chest: Apical right upper lobe 5 mm solid pulmonary nodule (series 9/ image 68). Other: Clear left mastoid air cells. Postsurgical changes in the right mastoid. No discrete thyroid nodules. No pathologically enlarged cervical nodes. IMPRESSION: 1. Small to moderate left posterior scalp hematoma. 2. No evidence of acute intracranial abnormality. No evidence of calvarial fracture. 3. No cervical spine fracture or subluxation. 4. Mild-to-moderate chronic small vessel ischemic change in the cerebral white matter. 5. Tiny fluid level in the left sphenoid sinus, cannot exclude mild acute sinusitis. 6. Moderate degenerative changes in the cervical spine as detailed. 7. Apical right upper lobe 5 mm solid pulmonary nodule. No follow-up needed if patient is low-risk. Non-contrast chest CT can be considered in 12 months if patient is high-risk. This recommendation follows the consensus statement: Guidelines for Management of Incidental Pulmonary Nodules Detected on CT Images: From the Fleischner Society 2017; Radiology 2017; 284:228-243. Electronically Signed   By: Ilona Sorrel M.D.   On: 05/01/2017 13:53   US Renal  Result Date: 05/03/2017 CLINICAL DATA:  Hypertension, chronic renal disease EXAM: RENAL / URINARY TRACT ULTRASOUND COMPLETE COMPARISON:  None. FINDINGS: Right Kidney: Length: 9.8 cm. Cortical thinning of the right kidney without obstructive uropathy, mass nor hydroureteronephrosis. Slight elevation in renal echogenicity is noted. Left Kidney: Length: 9.3 cm. Similar findings of renal cortical thinning without mass, calculus nor obstructive uropathy. Slight increase in renal echogenicity. Bladder: Appears normal for degree of bladder distention. IMPRESSION: Bilateral renal cortical thinning with slight increase in renal echogenicity consistent with changes of medical renal disease. Electronically Signed   By: Ashley Royalty M.D.   On:  05/03/2017 00:31    Microbiology: Recent Results (from the past 240 hour(s))  Urine culture     Status: None   Collection Time: 05/01/17  2:20 PM  Result Value Ref Range Status   Specimen Description URINE, CLEAN CATCH  Final   Special Requests NONE  Final   Culture NO GROWTH  Final   Report Status 05/02/2017 FINAL  Final  Blood culture (routine x 2)     Status: None (Preliminary result)   Collection Time: 05/01/17  2:49 PM  Result Value Ref Range Status   Specimen Description BLOOD RIGHT ANTECUBITAL  Final   Special Requests IN PEDIATRIC BOTTLE Blood Culture adequate volume  Final   Culture NO GROWTH 2 DAYS  Final   Report Status PENDING  Incomplete  Blood culture (routine x 2)     Status: None (Preliminary result)   Collection Time: 05/01/17  2:54 PM  Result Value Ref Range Status   Specimen Description BLOOD BLOOD RIGHT WRIST  Final   Special Requests   Final    BOTTLES DRAWN AEROBIC AND ANAEROBIC Blood Culture adequate volume   Culture NO GROWTH 2 DAYS  Final   Report Status PENDING  Incomplete     Labs: Basic Metabolic Panel: Recent Labs  Lab 05/01/17 1311 05/02/17 0343 05/03/17 0336  NA 138 139 141  K 4.1 4.8 4.2  CL 108 110 110  CO2 21* 20* 20*  GLUCOSE 100* 74 68  BUN 56*  54* 51*  CREATININE 1.72* 2.05* 1.95*  CALCIUM 9.5 9.0 9.3   Liver Function Tests: Recent Labs  Lab 05/02/17 0343  AST 120*  ALT 43  ALKPHOS 101  BILITOT 0.6  PROT 6.4*  ALBUMIN 3.1*   No results for input(s): LIPASE, AMYLASE in the last 168 hours. No results for input(s): AMMONIA in the last 168 hours. CBC: Recent Labs  Lab 05/01/17 1311 05/02/17 0343 05/03/17 0336  WBC 9.8 8.2 8.3  NEUTROABS  --   --  5.5  HGB 9.9* 9.5* 9.6*  HCT 30.4* 29.3* 30.4*  MCV 91.3 91.3 93.0  PLT 228 239 220   Cardiac Enzymes: Recent Labs  Lab 05/03/17 0336  CKTOTAL 524*   BNP: BNP (last 3 results) No results for input(s): BNP in the last 8760 hours.  ProBNP (last 3 results) No  results for input(s): PROBNP in the last 8760 hours.  CBG: Recent Labs  Lab 05/01/17 1304 05/02/17 2140 05/02/17 2251 05/03/17 0816 05/03/17 1159  GLUCAP 100* 66 115* 65 82       Signed:  Alma Friendly, MD [facility] 05/03/2017, 4:35 PM

## 2017-05-03 NOTE — Progress Notes (Signed)
No charge note  PMT meeting scheduled with son Susy FrizzleMatt today at 12:00.  Norvel RichardsMarianne Letrice Pollok, PA-C Palliative Medicine Pager: [phone]

## 2017-05-03 NOTE — Progress Notes (Signed)
CBG 65 pt having breakfast

## 2017-05-06 LAB — CULTURE, BLOOD (ROUTINE X 2)
Culture: NO GROWTH
Culture: NO GROWTH
SPECIAL REQUESTS: ADEQUATE
SPECIAL REQUESTS: ADEQUATE

## 2017-07-10 ENCOUNTER — Encounter (HOSPITAL_BASED_OUTPATIENT_CLINIC_OR_DEPARTMENT_OTHER): Payer: Self-pay | Admitting: Adult Health

## 2017-07-10 ENCOUNTER — Other Ambulatory Visit: Payer: Self-pay

## 2017-07-10 ENCOUNTER — Emergency Department (HOSPITAL_BASED_OUTPATIENT_CLINIC_OR_DEPARTMENT_OTHER)
Admission: EM | Admit: 2017-07-10 | Discharge: 2017-07-11 | Disposition: A | Discharge status: Home / Self Care | Payer: Medicare HMO | Attending: Emergency Medicine | Admitting: Emergency Medicine

## 2017-07-10 DIAGNOSIS — N189 Chronic kidney disease, unspecified: Secondary | ICD-10-CM | POA: Diagnosis not present

## 2017-07-10 DIAGNOSIS — Z7901 Long term (current) use of anticoagulants: Secondary | ICD-10-CM | POA: Diagnosis not present

## 2017-07-10 DIAGNOSIS — K644 Residual hemorrhoidal skin tags: Secondary | ICD-10-CM | POA: Insufficient documentation

## 2017-07-10 DIAGNOSIS — K625 Hemorrhage of anus and rectum: Secondary | ICD-10-CM | POA: Diagnosis present

## 2017-07-10 DIAGNOSIS — I129 Hypertensive chronic kidney disease with stage 1 through stage 4 chronic kidney disease, or unspecified chronic kidney disease: Secondary | ICD-10-CM | POA: Diagnosis not present

## 2017-07-10 DIAGNOSIS — I2510 Atherosclerotic heart disease of native coronary artery without angina pectoris: Secondary | ICD-10-CM | POA: Insufficient documentation

## 2017-07-10 DIAGNOSIS — G3183 Dementia with Lewy bodies: Secondary | ICD-10-CM | POA: Diagnosis not present

## 2017-07-10 DIAGNOSIS — Z79899 Other long term (current) drug therapy: Secondary | ICD-10-CM | POA: Insufficient documentation

## 2017-07-10 DIAGNOSIS — K922 Gastrointestinal hemorrhage, unspecified: Secondary | ICD-10-CM | POA: Insufficient documentation

## 2017-07-10 NOTE — ED Provider Notes (Signed)
[facility] Provider Note: Lowella Dell, MD, FACEP  CSN: [id number] MRN: [id number] ARRIVAL: 07/10/17 at 2331 ROOM: [facility]   CHIEF COMPLAINT  Rectal Bleeding  Level 5 caveat: Dementia HISTORY OF PRESENT ILLNESS  07/10/17 11:54 PM Caitlyn Lynch is a 84 y.o. female with Lewy body dementia.  She has had rectal bleeding for as long as 3 weeks.  She is obsessed with using the toilet and reportedly sits on the toilet every 12 minutes.  Tonight she was noted to have had a significant amount of rectal bleeding which she, and her confused state, smeared all over her room.  It is not reported if this was pure blood or blood mixed with stool.  She is having no abdominal pain but constantly reports that she needs to move her bowels.  She has not had vomiting or diarrhea.   Past Medical History:  Diagnosis Date  . Arthropathy, unspecified, site unspecified   . Atrial fibrillation (HCC)   . Body mass index 28.0-28.9, adult   . CKD (chronic kidney disease)   . Contact with or exposure to other viral diseases(V01.79)   . Coronary artery disease   . Dementia   . Encounter for long-term (current) use of other medications   . Essential hypertension, benign    rate on EKG 56-60  . Gout, unspecified   . Memory loss    27/30 on MMSE  . Migraine, unspecified, without mention of intractable migraine without mention of status migrainosus    CT head, Negative 2014  . Other and unspecified hyperlipidemia   . Other cells and casts in urine   . Other ear problems   . Other specified noninflammatory disorder of vagina   . Pain in joint, shoulder region   . Personal history of other disorders of nervous system and sense organs    Seen by Dr. Hazle Quant   . Personal history of urinary (tract) infection   . Subclavian steal syndrome    noted on Carotid Doppler 08/2012    Past Surgical History:  Procedure Laterality Date  . EXTERNAL EAR SURGERY    . TONSILLECTOMY  1945    Family History    Problem Relation Age of Onset  . Cancer Father        Bladder  . Diabetes Son   . Alzheimer's disease Sister     Social History   Tobacco Use  . Smoking status: Never Smoker  . Smokeless tobacco: Never Used  Substance Use Topics  . Alcohol use: No  . Drug use: No    Prior to Admission medications   Medication Sig Start Date End Date Taking? Authorizing Provider  allopurinol (ZYLOPRIM) 100 MG tablet Take 100 mg by mouth daily.    [provider]  AMLODIPINE BESYLATE PO Take 5 mg by mouth.     [provider]  haloperidol (HALDOL) 2 MG/ML solution Take 1 mL (2 mg total) by mouth at bedtime as needed for agitation. 05/03/17   Briant Cedar, MD  losartan (COZAAR) 25 MG tablet Take 1 tablet (25 mg total) by mouth daily. 05/03/17 05/03/18  Briant Cedar, MD  nitroGLYCERIN (NITROSTAT) 0.4 MG SL tablet Place 0.4 mg under the tongue every 5 (five) minutes as needed for chest pain.    [provider]  oxybutynin (DITROPAN) 5 MG tablet Take 1 tablet (5 mg total) by mouth 2 (two) times daily. 05/03/17   Briant Cedar, MD  pantoprazole (PROTONIX) 20 MG tablet  11/19/15   [provider]  pramipexole (MIRAPEX) 0.25 MG tablet  11/23/15   [provider]  traMADol (ULTRAM) 50 MG tablet Take 50 mg by mouth every 6 (six) hours as needed.    [provider]  lisinopril-hydrochlorothiazide (PRINZIDE,ZESTORETIC) 20-12.5 MG per tablet Take 1 tablet by mouth daily.  01/01/15  [provider]  warfarin (COUMADIN) 2.5 MG tablet Take 2.5 mg by mouth 2 (two) times a week. Taking 1 daily PO, 1/2 on Tuesday and Thursday  01/01/15  [provider]    Allergies Memantine   REVIEW OF SYSTEMS     PHYSICAL EXAMINATION  Initial Vital Signs Blood pressure (!) 184/72, pulse 76, temperature 98.1 F (36.7 C), temperature source Oral, resp. rate 18, SpO2 96 %.  Examination General: Well-developed, well-nourished female in  no acute distress; appearance consistent with age of record HENT: normocephalic; atraumatic Eyes: pupils equal, round and reactive to light; extraocular muscles intact Neck: supple Heart: Irregular rhythm; systolic murmur loudest at left upper sternal border Lungs: clear to auscultation bilaterally Abdomen: soft; nondistended; nontender; no masses or hepatosplenomegaly; bowel sounds present Rectal: Bleeding external hemorrhoids; sphincter tone normal; formed, tan stool in rectal vault Extremities: No deformity; full range of motion; pulses normal Neurologic: Awake, alert, confused; motor function intact in all extremities and symmetric; no facial droop Skin: Warm and dry Psychiatric: Normal mood and affect   RESULTS  Summary of this visit's results, reviewed by myself:   EKG Interpretation  Date/Time:    Ventricular Rate:    PR Interval:    QRS Duration:   QT Interval:    QTC Calculation:   R Axis:     Text Interpretation:        Laboratory Studies: Results for orders placed or performed during the hospital encounter of 07/10/17 (from the past 24 hour(s))  CBC with Differential/Platelet     Status: Abnormal   Collection Time: 07/11/17 12:56 AM  Result Value Ref Range   WBC 13.4 (H) 4.0 - 10.5 K/uL   RBC 4.31 3.87 - 5.11 MIL/uL   Hemoglobin 11.5 (L) 12.0 - 15.0 g/dL   HCT 62.136.3 30.836.0 - 65.746.0 %   MCV 84.2 78.0 - 100.0 fL   MCH 26.7 26.0 - 34.0 pg   MCHC 31.7 30.0 - 36.0 g/dL   RDW 84.615.2 96.211.5 - 95.215.5 %   Platelets 280 150 - 400 K/uL   Neutrophils Relative % 76 %   Neutro Abs 10.0 (H) 1.7 - 7.7 K/uL   Lymphocytes Relative 16 %   Lymphs Abs 2.2 0.7 - 4.0 K/uL   Monocytes Relative 8 %   Monocytes Absolute 1.1 (H) 0.1 - 1.0 K/uL   Eosinophils Relative 0 %   Eosinophils Absolute 0.0 0.0 - 0.7 K/uL   Basophils Relative 0 %   Basophils Absolute 0.0 0.0 - 0.1 K/uL  Basic metabolic panel     Status: Abnormal   Collection Time: 07/11/17 12:56 AM  Result Value Ref Range   Sodium  139 135 - 145 mmol/L   Potassium 4.4 3.5 - 5.1 mmol/L   Chloride 107 101 - 111 mmol/L   CO2 19 (L) 22 - 32 mmol/L   Glucose, Bld 135 (H) 65 - 99 mg/dL   BUN 27 (H) 6 - 20 mg/dL   Creatinine, Ser 8.411.39 (H) 0.44 - 1.00 mg/dL   Calcium 9.3 8.9 - 32.410.3 mg/dL   GFR calc non Af Amer 34 (L) >60 mL/min   GFR calc Af [place]  39 (L) >60 mL/min   Anion gap 13 5 - 15   Imaging Studies: No results found.  ED COURSE  Nursing notes and initial vitals signs, including pulse oximetry, reviewed.  Vitals:   07/10/17 2338  BP: (!) 184/72  Pulse: 76  Resp: 18  Temp: 98.1 F (36.7 C)  TempSrc: Oral  SpO2: 96%   12:49 AM No significant relief of patient's urge to void her bowels after soap suds enema.  Patient declining further enema.  2:48 AM Patient passed a large formed stool in the bedside commode.  We will prescribe her some lidocaine rectal cream for comfort as she appears to be fixated on discomfort in that area.   PROCEDURES    ED DIAGNOSES     ICD-10-CM   1. Bleeding external hemorrhoids K64.4        Josephene Marrone, Jonny Ruiz, MD 07/11/17 [phone]

## 2017-07-10 NOTE — ED Triage Notes (Addendum)
PT has dementia, per family pt has bloody stools  Three a day that has been ongoing since last week. PT denies pain.  Denies use of blood thinner. Pt had a large bloody stool this evening before coming. FUrrowing of tongue noted.and dry lips. PT is with her daughter who doesn't know much about what is going on. PT lives with her son who recently was hit by a car.

## 2017-07-11 LAB — CBC WITH DIFFERENTIAL/PLATELET
Basophils Absolute: 0 10*3/uL (ref 0.0–0.1)
Basophils Relative: 0 %
EOS PCT: 0 %
Eosinophils Absolute: 0 10*3/uL (ref 0.0–0.7)
HCT: 36.3 % (ref 36.0–46.0)
Hemoglobin: 11.5 g/dL — ABNORMAL LOW (ref 12.0–15.0)
LYMPHS ABS: 2.2 10*3/uL (ref 0.7–4.0)
LYMPHS PCT: 16 %
MCH: 26.7 pg (ref 26.0–34.0)
MCHC: 31.7 g/dL (ref 30.0–36.0)
MCV: 84.2 fL (ref 78.0–100.0)
MONO ABS: 1.1 10*3/uL — AB (ref 0.1–1.0)
MONOS PCT: 8 %
Neutro Abs: 10 10*3/uL — ABNORMAL HIGH (ref 1.7–7.7)
Neutrophils Relative %: 76 %
PLATELETS: 280 10*3/uL (ref 150–400)
RBC: 4.31 MIL/uL (ref 3.87–5.11)
RDW: 15.2 % (ref 11.5–15.5)
WBC: 13.4 10*3/uL — ABNORMAL HIGH (ref 4.0–10.5)

## 2017-07-11 LAB — BASIC METABOLIC PANEL
Anion gap: 13 (ref 5–15)
BUN: 27 mg/dL — AB (ref 6–20)
CALCIUM: 9.3 mg/dL (ref 8.9–10.3)
CO2: 19 mmol/L — ABNORMAL LOW (ref 22–32)
CREATININE: 1.39 mg/dL — AB (ref 0.44–1.00)
Chloride: 107 mmol/L (ref 101–111)
GFR calc Af Amer: 39 mL/min — ABNORMAL LOW (ref >60)
GFR, EST NON AFRICAN AMERICAN: 34 mL/min — AB (ref >60)
GLUCOSE: 135 mg/dL — AB (ref 65–99)
Potassium: 4.4 mmol/L (ref 3.5–5.1)
Sodium: 139 mmol/L (ref 135–145)

## 2017-07-11 MED ORDER — LIDOCAINE (ANORECTAL) 5 % EX CREA: TOPICAL_CREAM | CUTANEOUS | 0 refills | Status: AC

## 2017-07-11 NOTE — ED Notes (Signed)
Soap suds enema performed and patient refused after half of the enema was infused.  Small soft stools noted.  Patient continuously complaint that she needs to use the commode.

## 2018-02-16 DEATH — deceased

## 2018-03-19 IMAGING — CT CT ABD-PELV W/O CM
2 of 4 series · 16 of 46 positions shown, 18 images · non-contrast
Comparison: CT abdomen pelvis 01/29/2015

CLINICAL DATA: Abdominal pain and fever

EXAM:
CT ABDOMEN AND PELVIS WITHOUT CONTRAST
TECHNIQUE: Multidetector CT imaging of the abdomen and pelvis was performed
following the standard protocol without IV contrast.

[Series 3: a/p w/o 5mm · axial · non-contrast · 0.82mm/px · z∈[+733,+1108]mm · 13 of 83 slices shown, 15 images]
[im 4/83  soft-tissue]
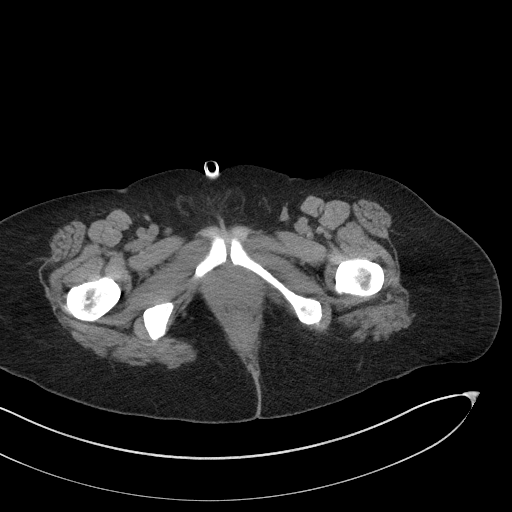
[im 4/83  bone]
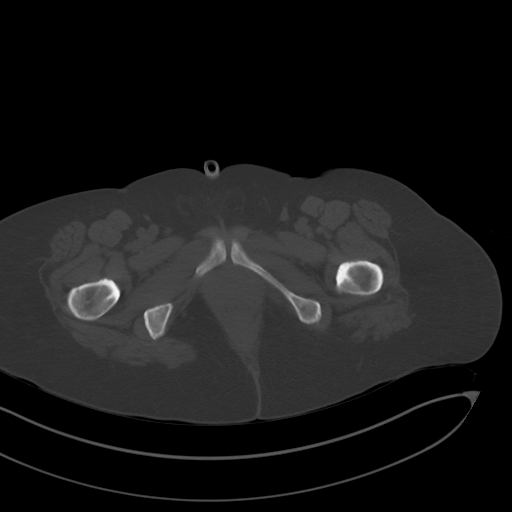
[im 12/83  soft-tissue]
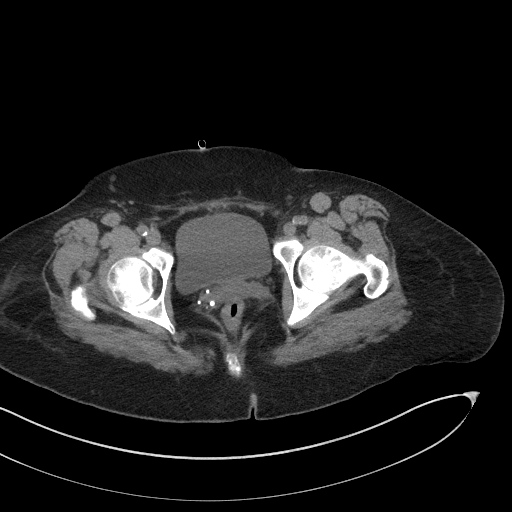
[im 19/83  soft-tissue]
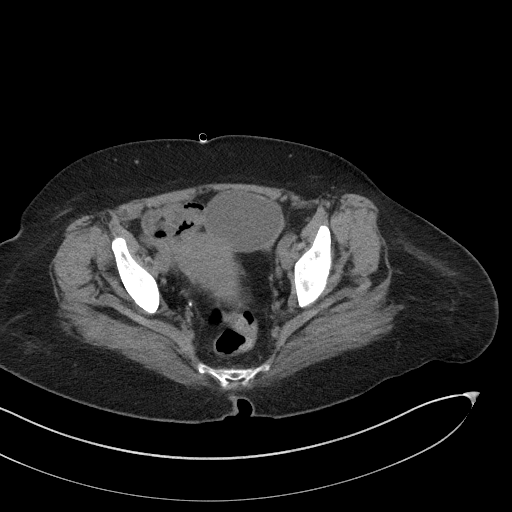
[im 23/83  soft-tissue]
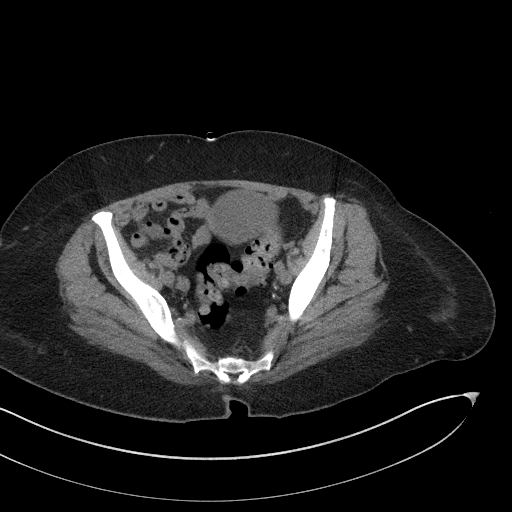
[im 30/83  soft-tissue]
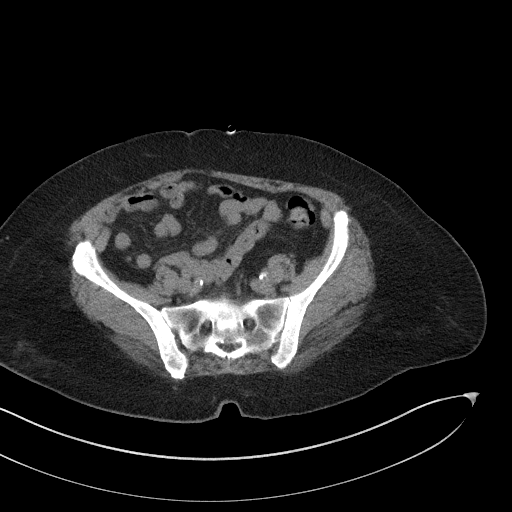
[im 34/83  soft-tissue]
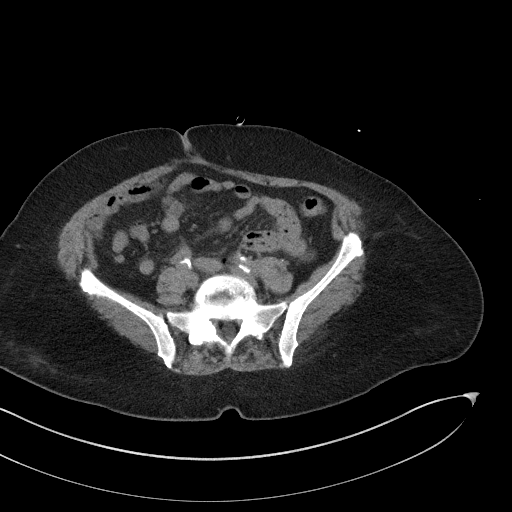
[im 42/83  soft-tissue]
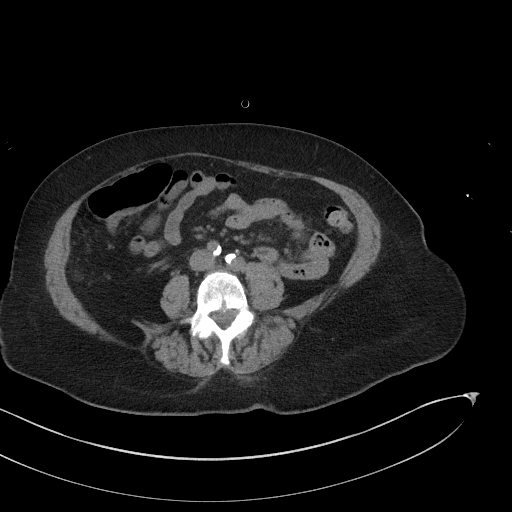
[im 49/83  soft-tissue]
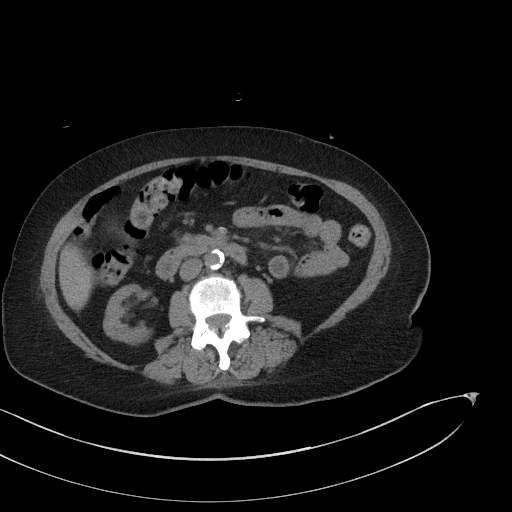
[im 53/83  soft-tissue]
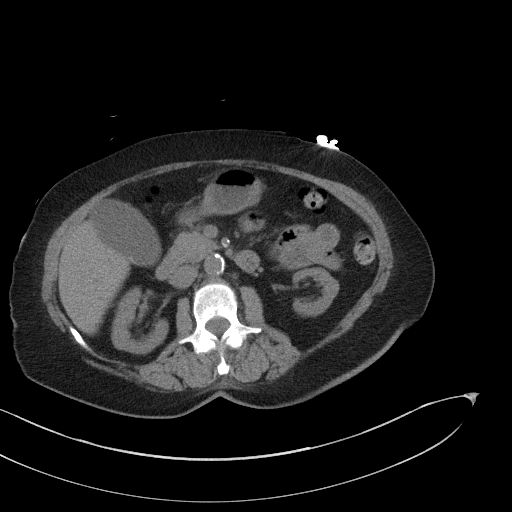
[im 53/83  bone]
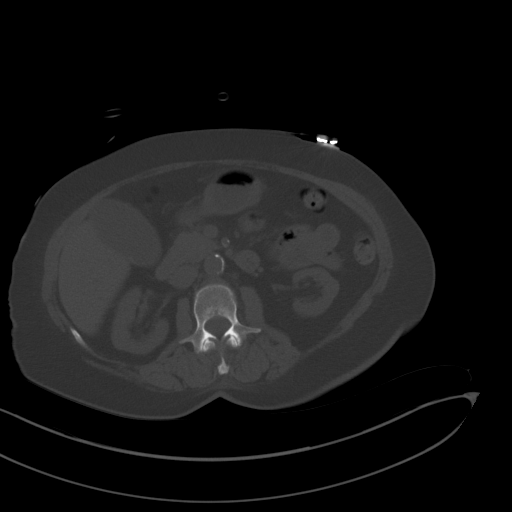
[im 60/83  soft-tissue]
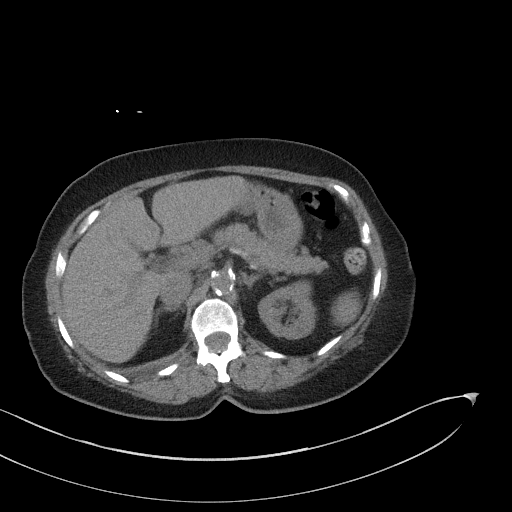
[im 64/83  soft-tissue]
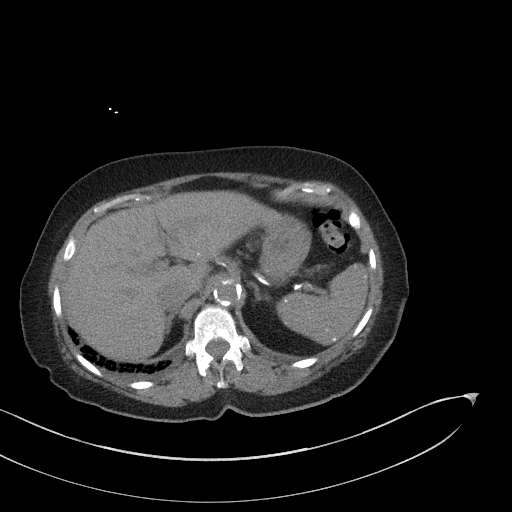
[im 71/83  soft-tissue]
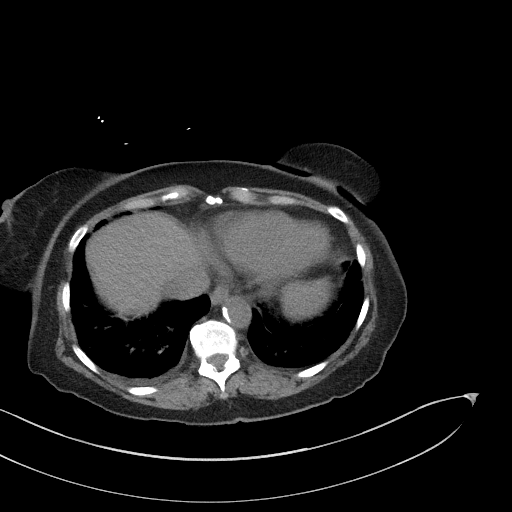
[im 79/83  soft-tissue]
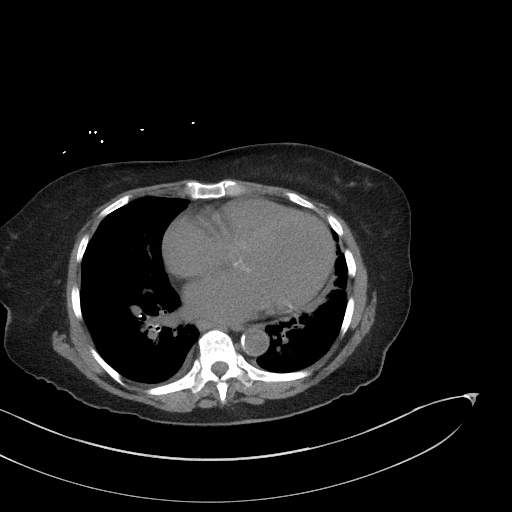

[Series 6: a/p w/o cor · coronal · non-contrast · 0.67mm/px · 3 of 121 slices shown]
[im 41/121  soft-tissue]
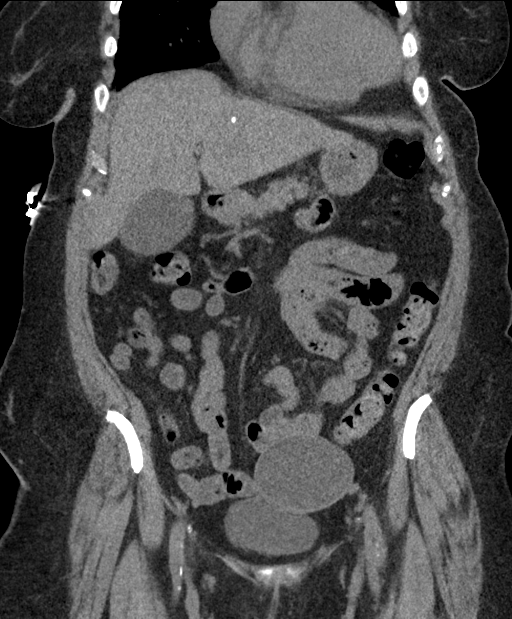
[im 54/121  soft-tissue]
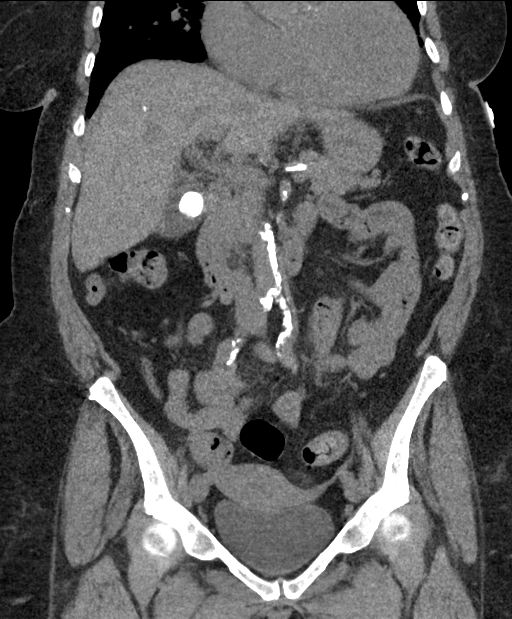
[im 67/121  soft-tissue]
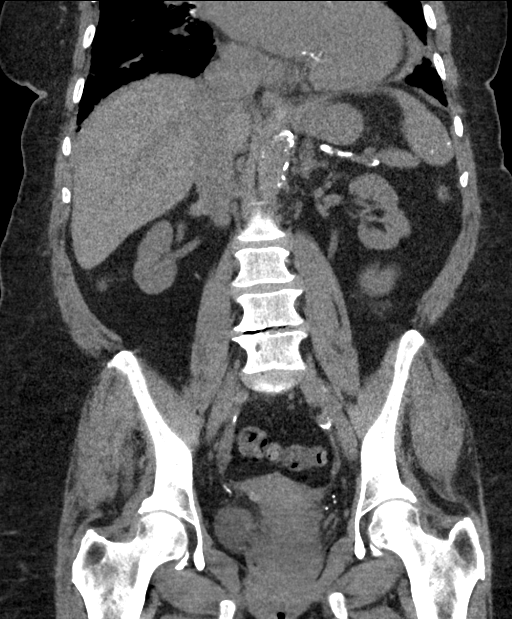

[16 of 46 positions shown; findings below may reference images not displayed]

FINDINGS: Lower chest: There is cardiomegaly. Small right pleural effusion.
Bilateral pulmonary septal thickening consistent with pulmonary
edema. Coronary artery calcification.

Hepatobiliary: Multiple calcified granulomata throughout the liver.
Large stone in the dependent portion of the gallbladder. No biliary
dilatation. No focal liver lesion.

Pancreas: Normal contours without ductal dilatation. No
peripancreatic fluid collection.

Spleen: Numerous splenic granulomata.

Adrenals/Urinary Tract:

--Adrenal glands: Normal.

--Right kidney/ureter: No hydronephrosis or perinephric stranding.
No nephrolithiasis. No obstructing ureteral stones. Moderate renal
atrophy.

--Left kidney/ureter: No hydronephrosis or perinephric stranding. No
nephrolithiasis. No obstructing ureteral stones. Moderate renal
atrophy.

--Urinary bladder: Unremarkable.

Stomach/Bowel:

--Stomach/Duodenum: No hiatal hernia or other gastric abnormality.
Normal duodenal course and caliber.

--Small bowel: No dilatation or inflammation.

--Colon: Rectosigmoid diverticulosis without acute inflammation.

--Appendix: Normal.

Vascular/Lymphatic: Atherosclerotic calcification is present within
the non-aneurysmal abdominal aorta, without hemodynamically
significant stenosis. No abdominal or pelvic lymphadenopathy.

Reproductive: The appearance of the uterus is normal. There is a
cystic lesion in the anterior left adnexa measuring 6.7 x 5.0 cm,
decreased in size from the prior study. This has been present on
studies as remote is 09/22/2013.

Musculoskeletal. Multilevel degenerative disc disease and facet
arthrosis. No bony spinal canal stenosis.

Other: None.
IMPRESSION: 1. No acute abdominopelvic abnormality.
2. Unchanged appearance of large calcified gallstone without
evidence of acute cholecystitis.
3. Decreased size of left adnexal cystic lesion, present as far back
as 09/22/2013.
4. Cardiomegaly with small right pleural effusion and bibasilar
pulmonary edema.
5. Coronary artery and Aortic Atherosclerosis ([id number]).

## 2018-10-01 IMAGING — US US RENAL
1 series · 14 of 25 positions shown · non-contrast
Comparison: None.

CLINICAL DATA: Hypertension, chronic renal disease

EXAM:
RENAL / URINARY TRACT ULTRASOUND COMPLETE

[Series 1: us renal · 0.18mm/px · 14 of 29 slices shown]
[im 1/29]
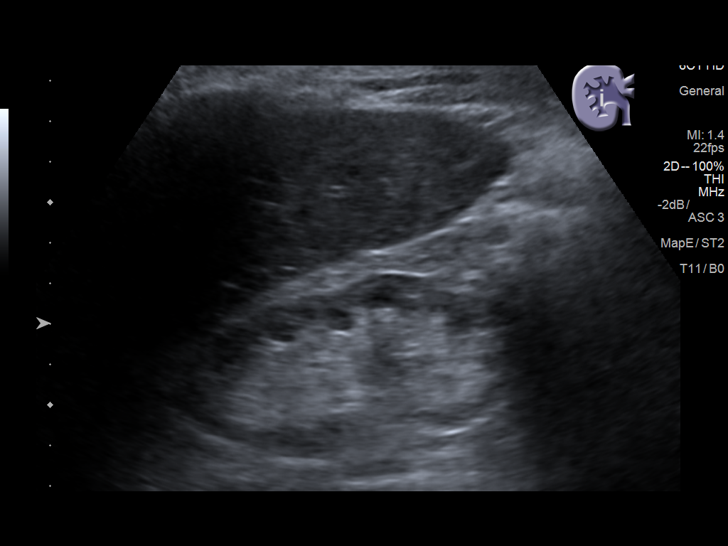
[im 3/29]
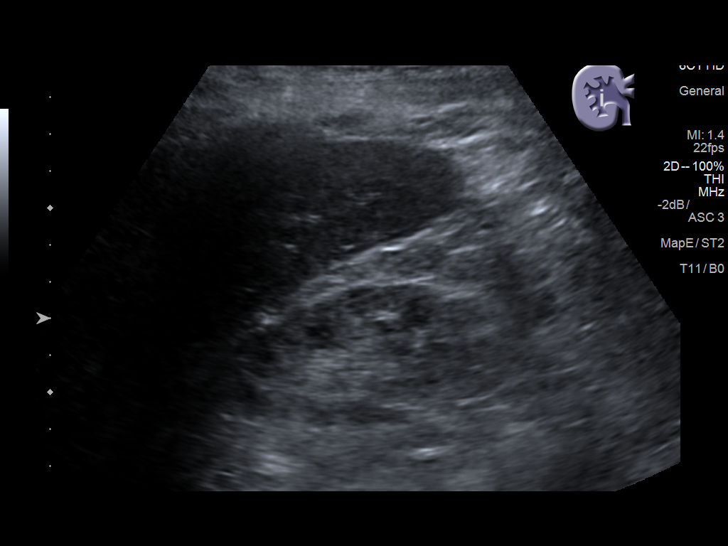
[im 5/29]
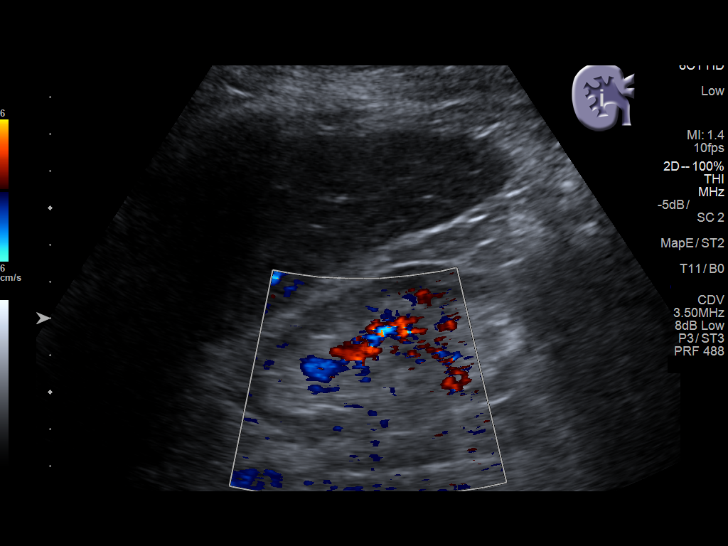
[im 8/29]
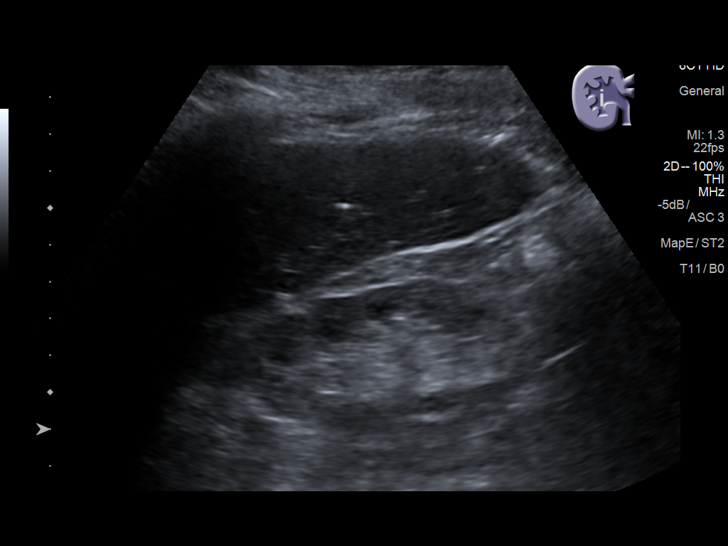
[im 10/29]
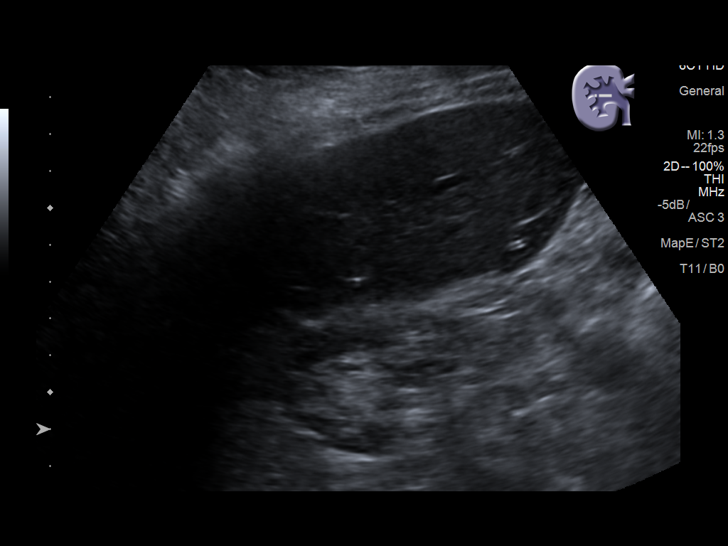
[im 11/29]
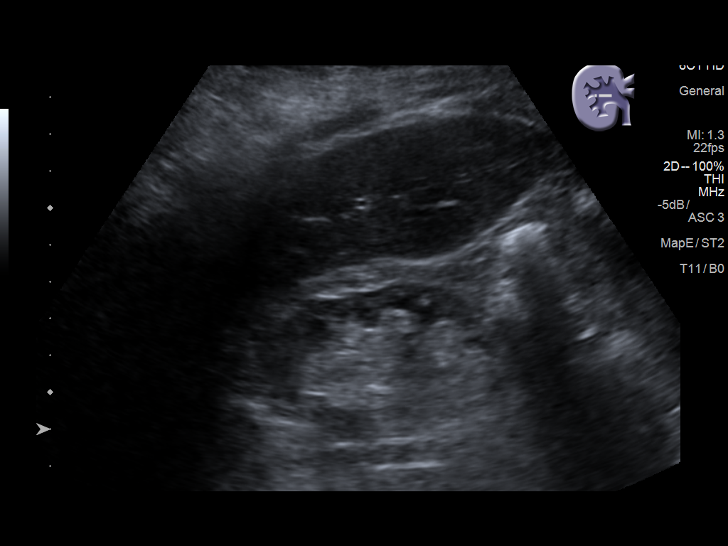
[im 13/29]
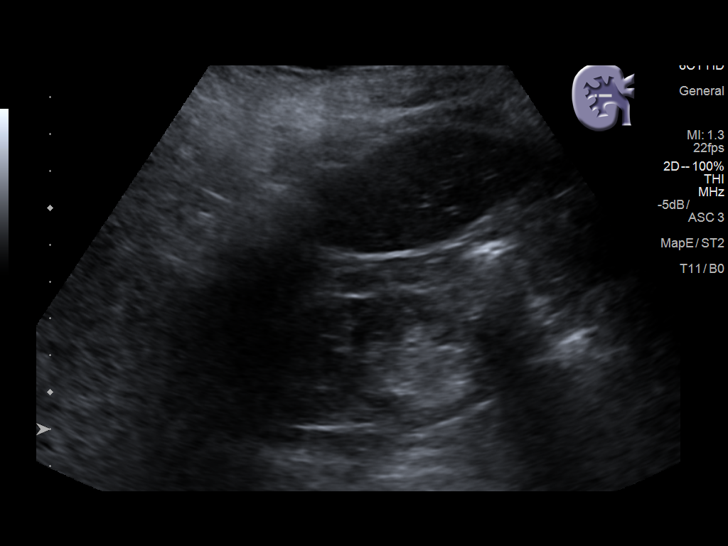
[im 16/29]
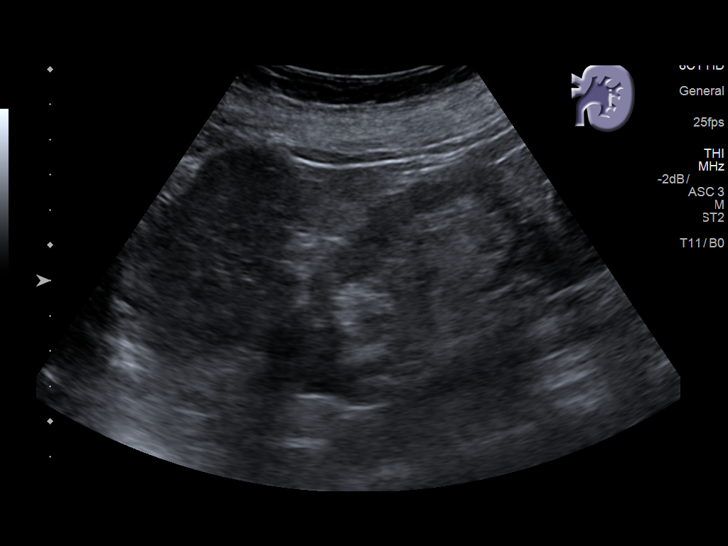
[im 18/29]
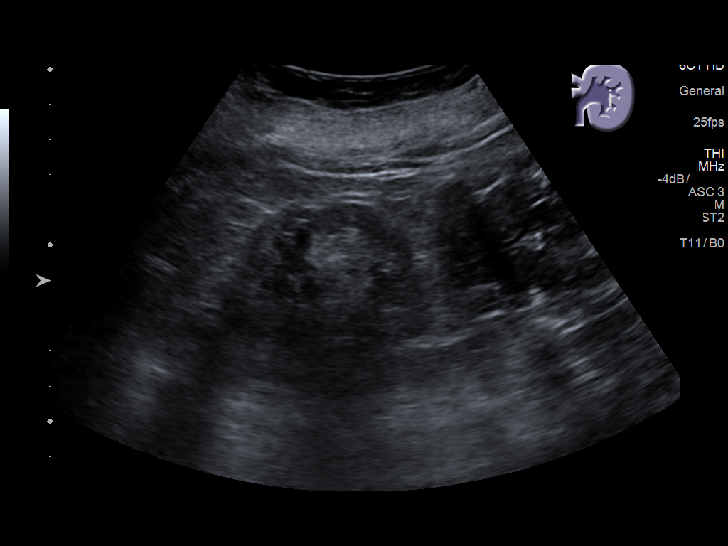
[im 19/29]
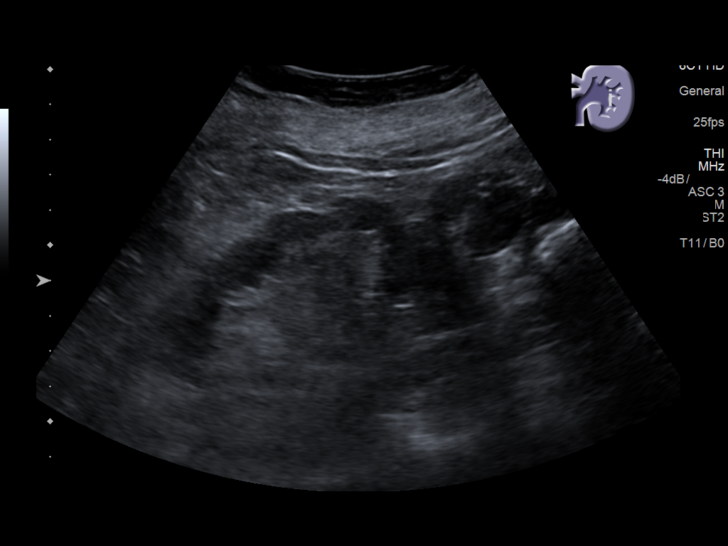
[im 22/29]
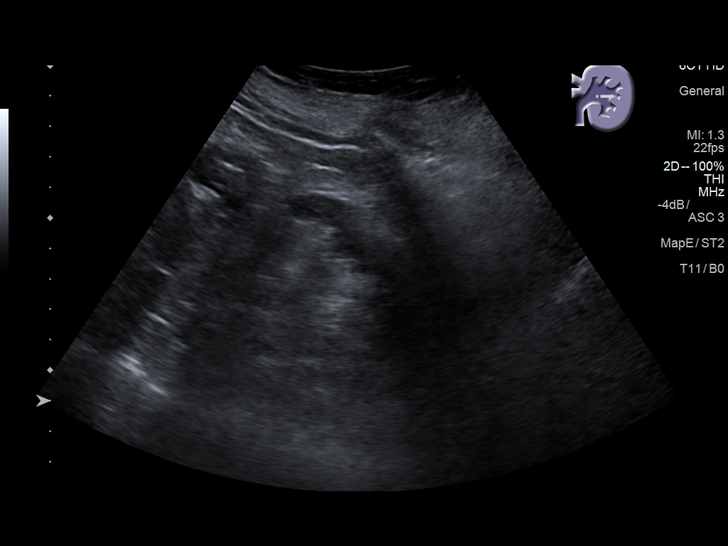
[im 24/29]
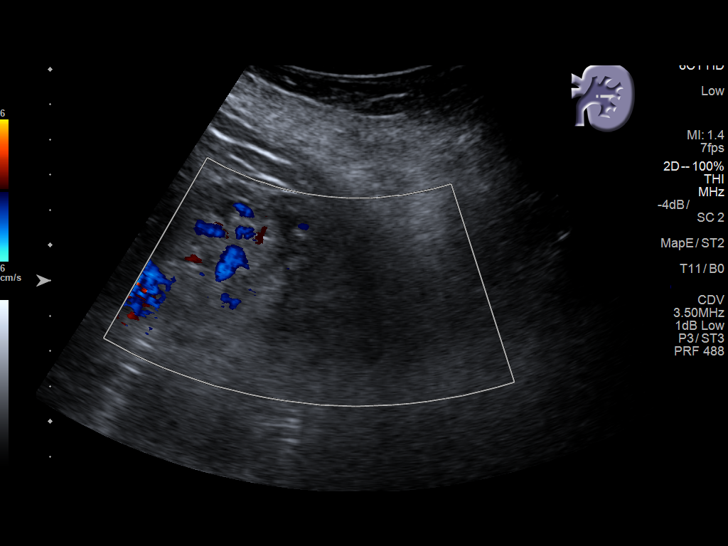
[im 26/29]
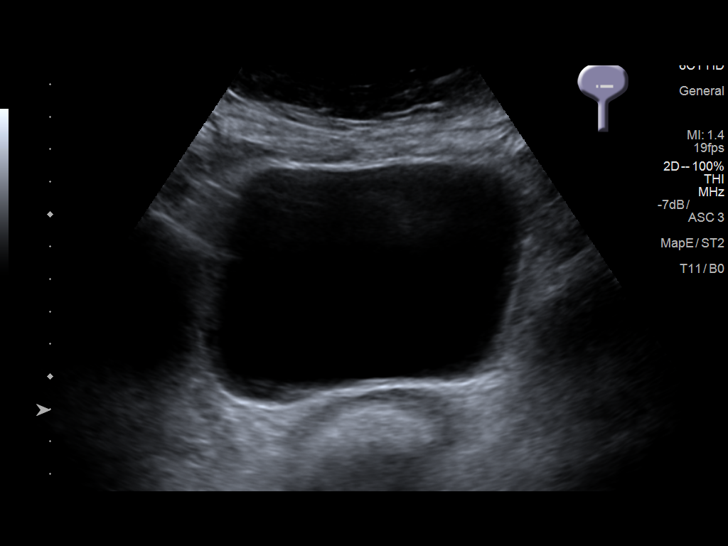
[im 29/29]
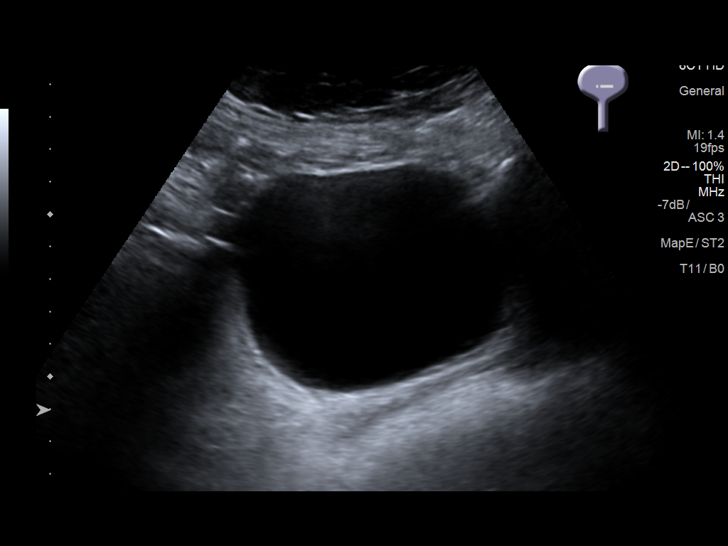

[14 of 25 positions shown; findings below may reference images not displayed]

FINDINGS: Right Kidney:

Length: 9.8 cm. Cortical thinning of the right kidney without
obstructive uropathy, mass nor hydroureteronephrosis. Slight
elevation in renal echogenicity is noted.

Left Kidney:

Length: 9.3 cm. Similar findings of renal cortical thinning without
mass, calculus nor obstructive uropathy. Slight increase in renal
echogenicity.

Bladder:

Appears normal for degree of bladder distention.
IMPRESSION: Bilateral renal cortical thinning with slight increase in renal
echogenicity consistent with changes of medical renal disease.
